# Patient Record
Sex: Female | Born: 1950
Health system: Southern US, Community
[De-identification: ages and names within clinical notes are randomized; demographics above are authoritative.]

## PROBLEM LIST (undated history)

## (undated) ENCOUNTER — Emergency Department (HOSPITAL_COMMUNITY): Admission: EM

## (undated) DIAGNOSIS — J439 Emphysema, unspecified: Secondary | ICD-10-CM

## (undated) DIAGNOSIS — L409 Psoriasis, unspecified: Secondary | ICD-10-CM

## (undated) DIAGNOSIS — I8393 Asymptomatic varicose veins of bilateral lower extremities: Secondary | ICD-10-CM

## (undated) DIAGNOSIS — G4733 Obstructive sleep apnea (adult) (pediatric): Secondary | ICD-10-CM

## (undated) DIAGNOSIS — L309 Dermatitis, unspecified: Secondary | ICD-10-CM

## (undated) DIAGNOSIS — R7303 Prediabetes: Secondary | ICD-10-CM

## (undated) DIAGNOSIS — F419 Anxiety disorder, unspecified: Secondary | ICD-10-CM

## (undated) DIAGNOSIS — M810 Age-related osteoporosis without current pathological fracture: Secondary | ICD-10-CM

## (undated) DIAGNOSIS — I779 Disorder of arteries and arterioles, unspecified: Secondary | ICD-10-CM

## (undated) DIAGNOSIS — E119 Type 2 diabetes mellitus without complications: Secondary | ICD-10-CM

## (undated) DIAGNOSIS — E785 Hyperlipidemia, unspecified: Secondary | ICD-10-CM

## (undated) DIAGNOSIS — G459 Transient cerebral ischemic attack, unspecified: Secondary | ICD-10-CM

## (undated) DIAGNOSIS — K5792 Diverticulitis of intestine, part unspecified, without perforation or abscess without bleeding: Secondary | ICD-10-CM

## (undated) DIAGNOSIS — F32A Depression, unspecified: Secondary | ICD-10-CM

## (undated) DIAGNOSIS — H269 Unspecified cataract: Secondary | ICD-10-CM

## (undated) DIAGNOSIS — K219 Gastro-esophageal reflux disease without esophagitis: Secondary | ICD-10-CM

## (undated) HISTORY — DX: Unspecified cataract: H26.9

## (undated) HISTORY — DX: Disorder of arteries and arterioles, unspecified: I77.9

## (undated) HISTORY — DX: Anxiety disorder, unspecified: F41.9

## (undated) HISTORY — DX: Emphysema, unspecified: J43.9

## (undated) HISTORY — DX: Transient cerebral ischemic attack, unspecified: G45.9

## (undated) HISTORY — DX: Obstructive sleep apnea (adult) (pediatric): G47.33

## (undated) HISTORY — DX: Hyperlipidemia, unspecified: E78.5

## (undated) HISTORY — DX: Prediabetes: R73.03

## (undated) HISTORY — DX: Psoriasis, unspecified: L40.9

## (undated) HISTORY — PX: LARYNX SURGERY: SHX692

## (undated) HISTORY — PX: ABDOMINAL HYSTERECTOMY: SHX81

## (undated) HISTORY — DX: Depression, unspecified: F32.A

## (undated) HISTORY — PX: HYSTEROTOMY: SHX1776

## (undated) HISTORY — DX: Dermatitis, unspecified: L30.9

## (undated) HISTORY — DX: Age-related osteoporosis without current pathological fracture: M81.0

## (undated) HISTORY — DX: Gastro-esophageal reflux disease without esophagitis: K21.9

## (undated) HISTORY — DX: Diverticulitis of intestine, part unspecified, without perforation or abscess without bleeding: K57.92

## (undated) HISTORY — DX: Type 2 diabetes mellitus without complications: E11.9

## (undated) HISTORY — PX: EYE SURGERY: SHX253

## (undated) HISTORY — PX: CHOLECYSTECTOMY: SHX55

## (undated) HISTORY — PX: COLON SURGERY: SHX602

## (undated) HISTORY — DX: Asymptomatic varicose veins of bilateral lower extremities: I83.93

---

## 1999-01-12 ENCOUNTER — Encounter: Admission: RE | Admit: 1999-01-12 | Discharge: 1999-01-20 | Payer: Self-pay | Admitting: Family Medicine

## 2000-02-20 ENCOUNTER — Encounter: Admission: RE | Admit: 2000-02-20 | Discharge: 2000-02-20 | Payer: Self-pay

## 2000-02-21 ENCOUNTER — Ambulatory Visit (HOSPITAL_BASED_OUTPATIENT_CLINIC_OR_DEPARTMENT_OTHER): Admission: RE | Admit: 2000-02-21 | Discharge: 2000-02-21 | Payer: Self-pay

## 2000-03-18 ENCOUNTER — Ambulatory Visit (HOSPITAL_BASED_OUTPATIENT_CLINIC_OR_DEPARTMENT_OTHER): Admission: RE | Admit: 2000-03-18 | Discharge: 2000-03-18 | Payer: Self-pay | Admitting: Family Medicine

## 2002-07-28 ENCOUNTER — Encounter: Admission: RE | Admit: 2002-07-28 | Discharge: 2002-07-28 | Payer: Self-pay | Admitting: Neurology

## 2002-07-28 ENCOUNTER — Encounter: Payer: Self-pay | Admitting: Neurology

## 2003-03-18 ENCOUNTER — Encounter: Payer: Self-pay | Admitting: Emergency Medicine

## 2003-03-18 ENCOUNTER — Emergency Department (HOSPITAL_COMMUNITY): Admission: EM | Admit: 2003-03-18 | Discharge: 2003-03-18 | Payer: Self-pay | Admitting: Emergency Medicine

## 2005-02-27 ENCOUNTER — Ambulatory Visit (HOSPITAL_COMMUNITY): Admission: RE | Admit: 2005-02-27 | Discharge: 2005-02-27 | Payer: Self-pay | Admitting: General Surgery

## 2005-08-13 ENCOUNTER — Observation Stay (HOSPITAL_COMMUNITY): Admission: EM | Admit: 2005-08-13 | Discharge: 2005-08-15 | Payer: Self-pay | Admitting: Emergency Medicine

## 2005-08-14 ENCOUNTER — Ambulatory Visit: Payer: Self-pay | Admitting: *Deleted

## 2007-01-06 ENCOUNTER — Ambulatory Visit: Payer: Self-pay | Admitting: Cardiology

## 2007-01-30 ENCOUNTER — Ambulatory Visit: Payer: Self-pay | Admitting: Cardiology

## 2007-02-11 ENCOUNTER — Ambulatory Visit: Payer: Self-pay | Admitting: Cardiology

## 2008-05-12 ENCOUNTER — Ambulatory Visit: Payer: Self-pay | Admitting: Cardiology

## 2009-01-31 ENCOUNTER — Encounter: Payer: Self-pay | Admitting: Cardiology

## 2009-02-14 DIAGNOSIS — L408 Other psoriasis: Secondary | ICD-10-CM | POA: Insufficient documentation

## 2009-02-14 DIAGNOSIS — F17200 Nicotine dependence, unspecified, uncomplicated: Secondary | ICD-10-CM | POA: Insufficient documentation

## 2009-02-14 DIAGNOSIS — J45909 Unspecified asthma, uncomplicated: Secondary | ICD-10-CM | POA: Insufficient documentation

## 2009-02-14 DIAGNOSIS — G4733 Obstructive sleep apnea (adult) (pediatric): Secondary | ICD-10-CM | POA: Insufficient documentation

## 2009-02-14 DIAGNOSIS — G459 Transient cerebral ischemic attack, unspecified: Secondary | ICD-10-CM | POA: Insufficient documentation

## 2009-02-15 ENCOUNTER — Ambulatory Visit: Payer: Self-pay | Admitting: Cardiology

## 2009-02-22 ENCOUNTER — Ambulatory Visit: Payer: Self-pay | Admitting: Cardiology

## 2009-02-24 ENCOUNTER — Telehealth: Payer: Self-pay | Admitting: Cardiology

## 2009-03-18 ENCOUNTER — Ambulatory Visit (HOSPITAL_COMMUNITY): Admission: RE | Admit: 2009-03-18 | Discharge: 2009-03-18 | Payer: Self-pay | Admitting: General Surgery

## 2009-07-19 ENCOUNTER — Inpatient Hospital Stay (HOSPITAL_COMMUNITY): Admission: EM | Admit: 2009-07-19 | Discharge: 2009-07-22 | Payer: Self-pay | Admitting: Emergency Medicine

## 2009-07-20 ENCOUNTER — Ambulatory Visit: Payer: Self-pay | Admitting: Internal Medicine

## 2009-07-20 ENCOUNTER — Encounter: Payer: Self-pay | Admitting: Cardiology

## 2009-07-23 ENCOUNTER — Inpatient Hospital Stay (HOSPITAL_COMMUNITY): Admission: EM | Admit: 2009-07-23 | Discharge: 2009-07-23 | Payer: Self-pay | Admitting: Emergency Medicine

## 2009-08-03 ENCOUNTER — Telehealth: Payer: Self-pay | Admitting: Cardiology

## 2009-08-26 ENCOUNTER — Encounter (INDEPENDENT_AMBULATORY_CARE_PROVIDER_SITE_OTHER): Payer: Self-pay | Admitting: General Surgery

## 2009-08-26 ENCOUNTER — Ambulatory Visit (HOSPITAL_COMMUNITY): Admission: RE | Admit: 2009-08-26 | Discharge: 2009-08-27 | Payer: Self-pay | Admitting: General Surgery

## 2009-09-12 ENCOUNTER — Encounter: Payer: Self-pay | Admitting: Cardiology

## 2010-07-23 DIAGNOSIS — K55069 Acute infarction of intestine, part and extent unspecified: Secondary | ICD-10-CM

## 2010-07-23 HISTORY — DX: Acute infarction of intestine, part and extent unspecified: K55.069

## 2010-08-22 NOTE — Letter (Signed)
Summary: [place] [facility] Visit  [facility] Surgery Visit   Imported By: Kassie Mends 08/16/2009 14:10:37  _____________________________________________________________________  External Attachment:    Type:   Image     Comment:   External Document

## 2010-08-22 NOTE — Letter (Signed)
Summary: Dr Jim Desanctis Rosenbower's Office Note  Dr Jim Desanctis Rosenbower's Office Note   Imported By: Roderic Ovens 10/04/2009 12:23:35  _____________________________________________________________________  External Attachment:    Type:   Image     Comment:   External Document

## 2010-08-22 NOTE — Consult Note (Signed)
Summary: Consultation Report - [facility]  Consultation Report - [facility]   Imported By: Marylou Mccoy 08/29/2009 09:41:54  _____________________________________________________________________  External Attachment:    Type:   Image     Comment:   External Document

## 2010-08-22 NOTE — Progress Notes (Signed)
Summary: NEED SURGICAL CLEARANCE  Phone Note From Other Clinic Call back at [phone]   Caller: Loney Laurence Call For: nurse Summary of Call: Message left on machine by [facility] that they need surgical clearance for patient to have surgery by Dr. Avel Peace. Nurse reviewed chart and patient last seen July 2010, had recent cath in 06/2009. Patient's gallbladder surgery is scheduled for February 1st 2011.  Initial call taken by: Carlye Grippe,  August 03, 2009 11:22 AM

## 2010-10-11 LAB — COMPREHENSIVE METABOLIC PANEL
ALT: 18 U/L (ref 0–35)
Albumin: 3.8 g/dL (ref 3.5–5.2)
Alkaline Phosphatase: 76 U/L (ref 39–117)
CO2: 28 mEq/L (ref 19–32)
Creatinine, Ser: 0.75 mg/dL (ref 0.4–1.2)
Glucose, Bld: 76 mg/dL (ref 70–99)
Total Protein: 6.9 g/dL (ref 6.0–8.3)

## 2010-10-11 LAB — DIFFERENTIAL
Basophils Absolute: 0.1 10*3/uL (ref 0.0–0.1)
Basophils Relative: 1 % (ref 0–1)
Eosinophils Relative: 1 % (ref 0–5)
Lymphocytes Relative: 21 % (ref 12–46)
Lymphs Abs: 1.6 10*3/uL (ref 0.7–4.0)
Monocytes Absolute: 0.4 10*3/uL (ref 0.1–1.0)
Monocytes Relative: 6 % (ref 3–12)

## 2010-10-11 LAB — CBC
MCHC: 34.8 g/dL (ref 30.0–36.0)
WBC: 7.4 10*3/uL (ref 4.0–10.5)

## 2010-10-23 LAB — CARDIAC PANEL(CRET KIN+CKTOT+MB+TROPI)
CK, MB: 1.4 ng/mL (ref 0.3–4.0)
Relative Index: INVALID (ref 0.0–2.5)
Relative Index: INVALID (ref 0.0–2.5)
Total CK: 43 U/L (ref 7–177)
Total CK: 54 U/L (ref 7–177)
Troponin I: 0.01 ng/mL (ref 0.00–0.06)
Troponin I: 0.06 ng/mL (ref 0.00–0.06)

## 2010-10-23 LAB — POCT CARDIAC MARKERS
CKMB, poc: <1 ng/mL — ABNORMAL LOW (ref 1.0–8.0)
CKMB, poc: <1 ng/mL — ABNORMAL LOW (ref 1.0–8.0)
Myoglobin, poc: 50.7 ng/mL (ref 12–200)
Troponin i, poc: <0.05 ng/mL (ref 0.00–0.09)
Troponin i, poc: <0.05 ng/mL (ref 0.00–0.09)
Troponin i, poc: <0.05 ng/mL (ref 0.00–0.09)

## 2010-10-23 LAB — DIFFERENTIAL
Basophils Absolute: 0 10*3/uL (ref 0.0–0.1)
Eosinophils Relative: 2 % (ref 0–5)
Eosinophils Relative: 2 % (ref 0–5)
Lymphocytes Relative: 18 % (ref 12–46)
Lymphocytes Relative: 21 % (ref 12–46)
Monocytes Absolute: 0.4 10*3/uL (ref 0.1–1.0)
Monocytes Absolute: 0.5 10*3/uL (ref 0.1–1.0)
Monocytes Relative: 5 % (ref 3–12)
Neutrophils Relative %: 72 % (ref 43–77)

## 2010-10-23 LAB — LIPASE, BLOOD: Lipase: 20 U/L (ref 11–59)

## 2010-10-23 LAB — URINALYSIS, ROUTINE W REFLEX MICROSCOPIC
Bilirubin Urine: NEGATIVE
Bilirubin Urine: NEGATIVE
Ketones, ur: NEGATIVE mg/dL
Nitrite: NEGATIVE
Protein, ur: NEGATIVE mg/dL
Protein, ur: NEGATIVE mg/dL
Specific Gravity, Urine: 1.024 (ref 1.005–1.030)
Urobilinogen, UA: 0.2 mg/dL (ref 0.0–1.0)
pH: 6.5 (ref 5.0–8.0)

## 2010-10-23 LAB — COMPREHENSIVE METABOLIC PANEL
ALT: 18 U/L (ref 0–35)
AST: 19 U/L (ref 0–37)
Albumin: 3.3 g/dL — ABNORMAL LOW (ref 3.5–5.2)
Calcium: 9 mg/dL (ref 8.4–10.5)
Creatinine, Ser: 0.58 mg/dL (ref 0.4–1.2)
GFR calc Af Amer: >60 mL/min (ref >60)
GFR calc non Af Amer: >60 mL/min (ref >60)
Potassium: 3.7 mEq/L (ref 3.5–5.1)
Sodium: 139 mEq/L (ref 135–145)
Total Protein: 6.4 g/dL (ref 6.0–8.3)

## 2010-10-23 LAB — HEPATIC FUNCTION PANEL
AST: 24 U/L (ref 0–37)
Albumin: 3.1 g/dL — ABNORMAL LOW (ref 3.5–5.2)
Albumin: 3.2 g/dL — ABNORMAL LOW (ref 3.5–5.2)
Alkaline Phosphatase: 69 U/L (ref 39–117)
Indirect Bilirubin: 0.2 mg/dL — ABNORMAL LOW (ref 0.3–0.9)
Total Bilirubin: 0.4 mg/dL (ref 0.3–1.2)
Total Bilirubin: 0.7 mg/dL (ref 0.3–1.2)
Total Protein: 6.2 g/dL (ref 6.0–8.3)

## 2010-10-23 LAB — CBC
HCT: 38.4 % (ref 36.0–46.0)
HCT: 40.3 % (ref 36.0–46.0)
HCT: 42.6 % (ref 36.0–46.0)
Hemoglobin: 14.1 g/dL (ref 12.0–15.0)
Hemoglobin: 15 g/dL (ref 12.0–15.0)
MCHC: 34.8 g/dL (ref 30.0–36.0)
MCHC: 35 g/dL (ref 30.0–36.0)
MCHC: 35.1 g/dL (ref 30.0–36.0)
MCHC: 35.3 g/dL (ref 30.0–36.0)
MCHC: 35.7 g/dL (ref 30.0–36.0)
MCV: 95.6 fL (ref 78.0–100.0)
Platelets: 206 10*3/uL (ref 150–400)
Platelets: 242 10*3/uL (ref 150–400)
Platelets: 243 10*3/uL (ref 150–400)
RBC: 4.27 MIL/uL (ref 3.87–5.11)
RBC: 4.5 MIL/uL (ref 3.87–5.11)
RDW: 13.6 % (ref 11.5–15.5)
RDW: 13.8 % (ref 11.5–15.5)
RDW: 13.9 % (ref 11.5–15.5)
RDW: 14 % (ref 11.5–15.5)
WBC: 6.4 10*3/uL (ref 4.0–10.5)
WBC: 9 10*3/uL (ref 4.0–10.5)

## 2010-10-23 LAB — BASIC METABOLIC PANEL
BUN: 10 mg/dL (ref 6–23)
CO2: 27 mEq/L (ref 19–32)
CO2: 27 mEq/L (ref 19–32)
CO2: 28 mEq/L (ref 19–32)
Calcium: 8.8 mg/dL (ref 8.4–10.5)
Calcium: 9.4 mg/dL (ref 8.4–10.5)
Chloride: 106 mEq/L (ref 96–112)
Creatinine, Ser: 0.7 mg/dL (ref 0.4–1.2)
GFR calc Af Amer: >60 mL/min (ref >60)
GFR calc Af Amer: >60 mL/min (ref >60)
GFR calc Af Amer: >60 mL/min (ref >60)
GFR calc non Af Amer: >60 mL/min (ref >60)
GFR calc non Af Amer: >60 mL/min (ref >60)
GFR calc non Af Amer: >60 mL/min (ref >60)
Glucose, Bld: 113 mg/dL — ABNORMAL HIGH (ref 70–99)
Glucose, Bld: 129 mg/dL — ABNORMAL HIGH (ref 70–99)
Glucose, Bld: 92 mg/dL (ref 70–99)
Potassium: 4 mEq/L (ref 3.5–5.1)
Potassium: 4.2 mEq/L (ref 3.5–5.1)
Potassium: 4.3 mEq/L (ref 3.5–5.1)
Sodium: 138 mEq/L (ref 135–145)
Sodium: 141 mEq/L (ref 135–145)

## 2010-10-23 LAB — LIPID PANEL
Cholesterol: 309 mg/dL — ABNORMAL HIGH (ref 0–200)
LDL Cholesterol: UNDETERMINED mg/dL (ref 0–99)
Triglycerides: 459 mg/dL — ABNORMAL HIGH (ref <150)

## 2010-10-23 LAB — ABO/RH: ABO/RH(D): A POS

## 2010-10-23 LAB — CK TOTAL AND CKMB (NOT AT ARMC)
CK, MB: 1.3 ng/mL (ref 0.3–4.0)
CK, MB: 1.4 ng/mL (ref 0.3–4.0)
Relative Index: INVALID (ref 0.0–2.5)
Total CK: 52 U/L (ref 7–177)

## 2010-10-23 LAB — APTT: aPTT: 30 seconds (ref 24–37)

## 2010-10-23 LAB — TYPE AND SCREEN: ABO/RH(D): A POS

## 2010-10-23 LAB — TROPONIN I: Troponin I: 0.01 ng/mL (ref 0.00–0.06)

## 2010-10-23 LAB — PROTIME-INR: Prothrombin Time: 11.7 seconds (ref 11.6–15.2)

## 2010-10-28 LAB — COMPREHENSIVE METABOLIC PANEL
ALT: 18 U/L (ref 0–35)
AST: 18 U/L (ref 0–37)
Albumin: 3.7 g/dL (ref 3.5–5.2)
CO2: 29 mEq/L (ref 19–32)
Calcium: 9.6 mg/dL (ref 8.4–10.5)
Creatinine, Ser: 0.68 mg/dL (ref 0.4–1.2)
GFR calc Af Amer: >60 mL/min (ref >60)
Sodium: 142 mEq/L (ref 135–145)

## 2010-10-28 LAB — DIFFERENTIAL
Eosinophils Absolute: 0 10*3/uL (ref 0.0–0.7)
Eosinophils Relative: 1 % (ref 0–5)
Lymphocytes Relative: 21 % (ref 12–46)
Lymphs Abs: 1.8 10*3/uL (ref 0.7–4.0)
Monocytes Absolute: 0.5 10*3/uL (ref 0.1–1.0)
Monocytes Relative: 6 % (ref 3–12)

## 2010-10-28 LAB — CBC
HCT: 46.3 % — ABNORMAL HIGH (ref 36.0–46.0)
MCHC: 34.7 g/dL (ref 30.0–36.0)
MCV: 93.2 fL (ref 78.0–100.0)
Platelets: 244 10*3/uL (ref 150–400)
RDW: 13.3 % (ref 11.5–15.5)

## 2010-12-05 NOTE — Assessment & Plan Note (Signed)
[facility]                          [facility] NOTE   NAME:Brianna Nichols, Brianna Nichols                         MRN:          [id number]  DATE:02/11/2007                            DOB:          12-09-1950    PRIMARY:  Dr. Doreen Beam.   REASON FOR PRESENTATION:  Evaluate the patient for chest discomfort and  abnormal stress perfusion study.   HISTORY OF PRESENT ILLNESS:  The patient is a pleasant 60 year old who  was hospitalized in June with discomfort that ultimately was found to be  diverticulitis.  She says she was treated with Nubain and she thinks she  had a reaction.  She eventually got chest discomfort and some low blood  pressures.  She was seen in consultation.  She had a CT that was  negative for any evidence of pulmonary embolism.  She had negative  cardiac enzymes.  She did have an echocardiogram, which demonstrated  well-preserved ejection fraction, mild TR, mild pulmonary hypertension.  Her EF was 65% with normal wall motion.  She subsequently had an  outpatient stress perfusion study, which demonstrated an EF of 70%.  She  achieved 8.5 METS.  There was a small partially reversible anterior  defect, questionably ischemia versus breast attenuation.   The patient returns today and says she was able to mow her entire lawn  this weekend without chest discomfort.  She was fatigued.  She has  continued to have abdominal discomfort, consistent with her previous  diverticulitis.  However, she has not had any exercise-induced chest  pressure, neck or arm discomfort.  She has had no palpitations, pre-  syncope, or syncope.  She has had no new shortness of breath.  Denies  any PND or orthopnea.  She has had some fleeting sporadic chest  pressure.   PAST MEDICAL HISTORY:  Diverticulitis, asthma, psoriasis, obstructive  sleep apnea (she has been intolerant of CPAP and has an upcoming sleep  study).  Long-standing tobacco abuse.  Hysterectomy.   Transient ischemic  attack.   ALLERGIES:  MORPHINE.  NUBAIN.  PENICILLIN.  BENADRYL.   MEDICATIONS:  Aspirin.   REVIEW OF SYSTEMS:  As stated in the HPI and otherwise negative for  other systems.   PHYSICAL EXAMINATION:  The patient is in no acute distress.  Blood pressure 117/88, heart rate 87 and irregular.  Weight 150.8  pounds.  HEENT:  Eyelids unremarkable.  Pupils are equal, round, and reactive to  light and accommodation.  Fundi not visualized.  Oral mucosa  unremarkable.  NECK:  No jugular venous distension, wave form within normal limits,  carotid upstroke brisk and symmetric, no bruits, thyromegaly.  LYMPHATICS:  No cervical, axillary, or inguinal adenopathy.  LUNGS:  Clear to auscultation bilaterally.  BACK:  No costovertebral angle tenderness.  CHEST:  Unremarkable.  HEART:  PMI not displaced or sustained, S1 and S2 within normal limits,  no S3, no S4, no clicks, rubs, murmurs, distant heart sounds.  ABDOMEN:  Flat, positive bowel sounds, normal in frequency and pitch, no  bruits, rebound, guarding.  No midline pulsatile mass, hepatomegaly,  splenomegaly.  SKIN:  No rashes, no nodules.  EXTREMITIES:  With 2+ pulses throughout, no edema, cyanosis, clubbing.  NEURO:  Oriented to person, place, and time, cranial nerves 2-12 grossly  intact, motor grossly intact.   EKG:  Sinus rhythm.  Rate 85.  Axis within normal limits.  Intervals  within normal limits.  No acute ST-T wave change.   ASSESSMENT AND PLAN:  1. Chest discomfort.  The patient had chest discomfort in the      hospital, but is not getting this kind of discomfort any longer.      She is able to be very active without bringing on any symptoms.      She had a stress perfusion study, which was either normal with      breast attenuation or demonstrated a very low risk reversible      anterior ischemia.  We talked at great length about this.  At this      point, I do not think with her very high functional  level and      absence of any unstable symptoms that further evaluation with      catheterization is warranted.  However, she needs aggressive risk      reduction.  2. We had a long discussion about the need to stop smoking.  She has      cut back from a carton a week to a pack a week.  She understands      the need to quit completely and will try to do this on her own.  3. Risk reduction.  Her total cholesterol is 240, HDL 28.  Her      triglycerides were 417.  LDL was not calculated and there is no      direct LDL.  The patient needs aggressive dietary modification and      I would suggest statin therapy, but will defer to Dr. Sherril Croon.      Currently, she is having enough GI complaints that I would not      start one of these drugs.     Rollene Rotunda, MD, [facility]  Electronically Signed    JH/MedQ  DD: 02/11/2007  DT: 02/11/2007  Job #: [id number]

## 2010-12-05 NOTE — Assessment & Plan Note (Signed)
[facility]                          [facility] NOTE   NAME:Brianna Nichols, Brianna Nichols                         MRN:          [id number]  DATE:02/15/2009                            DOB:          23-Nov-1950    REFERRING PHYSICIAN:  Adolph Pollack, M.D.   PRIMARY CARE PHYSICIAN:  Doreen Beam, MD   REASON FOR CONSULTATION:  Evaluate the patient preoperatively.  She has  multiple cardiovascular risk factors, chest pain, and is being referred  for cholecystectomy.   HISTORY OF PRESENT ILLNESS:  The patient has been seen in this clinic in  the past.  She has no prior cardiac history but has had evaluation for  chest pain.  She had a stress perfusion study in 2008 and demonstrated  an ejection fraction of 70% with a questionable small partial reversible  anterior defect.  However, it was felt to be very low-risk scan, and she  was managed medically.  She has had echocardiograms as well.  The last  was in 2009 demonstrating well-preserved ejection fraction, very mildly  elevated right ventricular systolic pressure of 32, but no valvular  abnormalities.   She is now being considered for cholecystectomy.  She describes chest  discomfort when she walks the dog.  This has been happening from the  last couple of weeks.  It is tightness in her midchest.  It goes away  when she stops what she is doing.  She does not have this at rest.  She  is not describing shortness of breath.  She does not have excessive  diaphoresis with this.  She does not describe any resting symptoms such  as PND or orthopnea.  She has not had any palpitations, presyncope, or  syncope.  She has multiple aches and pains.  She describes occasional  stinging in her chest, some pain in her posterior neck with movement.  She recently had some facial pain and burning that she attributed to  taking Levaquin for bronchitis.  She is not sure whether any of her  symptoms are at all similar to  complaints she had back in 2008 as she  does not recall these events.   PAST MEDICAL HISTORY:  1. Diverticulitis.  2. Asthma.  3. Psoriasis.  4. Obstructive sleep apnea (intolerant to CPAP).  5. Borderline dyslipidemia by her report.  6. Tobacco abuse.  7. Mild hypertension.  8. Apparent transient ischemic attack.   PAST SURGICAL HISTORY:  1. Tubal ligation.  2. Hysterectomy.  3. Vein stripping.   ALLERGIES:  MORPHINE, NUBAIN, PENICILLIN, and BENADRYL.   MEDICATIONS:  None.   SOCIAL HISTORY:  The patient has children, grandchildren, and great-  grandchildren.  She has been smoking at least 1 pack per day greater  than 20 years.   Family history is remarkable for her brother dying of myocardial  infarction at age 60.  Her father had later-onset heart disease.   REVIEW OF SYSTEMS:  As stated in the HPI and otherwise negative for all  other systems.   PHYSICAL EXAMINATION:  GENERAL:  The patient is pleasant and  in no  distress.  VITAL SIGNS:  Blood pressure 130/81, heart 81 and regular, weight 152  pounds.  HEENT:  Eyes unremarkable.  Pupils equal, round, and reactive to light.  Fundi not visualized.  Oral mucosa unremarkable.  NECK:  No jugular venous distention at 45 degrees.  Carotid upstroke  brisk and symmetric.  No bruits.  No thyromegaly.  LYMPHATICS:  No cervical, axillary, or inguinal adenopathy.  LUNGS:  Clear to auscultation bilaterally.  BACK:  No costovertebral angle tenderness.  CHEST:  Unremarkable.  HEART:  PMI not displaced or sustained.  S1 and S2 within normal limits.  No S3.  No S4.  No clicks.  No rubs.  No murmurs.  ABDOMEN:  Flat.  Positive bowel sounds, normal in frequency and pitch.  No bruits.  No rebound.  No guarding.  No midline pulsatile mass.  No  hepatomegaly.  No splenomegaly.  SKIN:  No rashes.  No nodules.  EXTREMITIES:  Pulses 2+ throughout.  No edema.  No cyanosis.  No  clubbing.  NEUROLOGIC:  Oriented to person, place, and time.   Cranial nerves II  through XII grossly intact.  Motor grossly intact.   EKG:  Sinus rhythm, rate 78, axis within normal limits, intervals within  normal limits, no acute ST-wave changes.   ASSESSMENT AND PLAN:  1. Chest pain.  The patient does have chest pain.  She has multiple      cardiovascular risk factors.  The pretest probability of      obstructive coronary disease is moderate.  I do not think cardiac      catheterization is indicated.  However, stress perfusion testing is      indicated.  She would be able to walk on a treadmill but could be      converted to an adenosine if needed.  She will have an adenosine      Myoview.  If this demonstrates no change from the previous or is      normal, then she would be at acceptable risk for the planned      surgery.  2. Tobacco.  I discussed the need to stop smoking (greater than 3      minutes).  She does not want to try Chantix.  She will consider      Wellbutrin.  She will consider the patches.  She understands the      importance of this.  3. Risk reduction.  I think this patient should take an aspirin given      her family history.  This could be started after the surgery.  I do      not know her lipid status but would have a low threshold for statin      therapy, although it seems like she is resistant to medications.  I      will defer to Dr. Sherril Croon.  4. Hypertension.  She reports some mild hypertension, but I see her      blood pressure to be normal.  No further therapy is indicated at      this point.  5. Follow up will be as-needed based on the results of the stress      perfusion study.     Rollene Rotunda, MD, [facility]  Electronically Signed    JH/MedQ  DD: 02/15/2009  DT: 02/15/2009  Job #: [id number]   cc:   Adolph Pollack, M.D.  Doreen Beam, MD

## 2010-12-08 NOTE — H&P (Signed)
Brianna Nichols, Brianna Nichols                  ACCOUNT NO.:  [id number]   MEDICAL RECORD NO.:  [id number]          PATIENT TYPE:  OBV   LOCATION:  A223                          FACILITY:  [facility]   PHYSICIAN:  Hanley Hays. Dechurch, M.D.DATE OF BIRTH:  12-Oct-1950   DATE OF ADMISSION:  08/13/2005  DATE OF DISCHARGE:  LH                                HISTORY & PHYSICAL   HISTORY OF PRESENT ILLNESS:  A 60 year old Caucasian female with a history  of tobacco abuse, obstructive sleep apnea, and she states high  cholesterol, though she is not on any medications, who presented to the  emergency room today with chest pain, weakness that started actually 5 days  prior, acutely while at work at her desk.  She states she had the sudden  onset of stabbing chest pain which she described as severe.  She has felt  weak and dizzy.  The pain radiates into her left neck and into the under  part of her left upper arm.  It persisted for several hours, but began to  ease off.  She states she has had the pain intermittently over the course of  the last 5 days.  Simultaneously, she developed low back pain with radiation  to the right scapula.  She also described her pain as going through-and-  through.  She had nausea but no vomiting.  She felt short of breath, but had  no increased dyspnea on exertion.  She was able to continue working in her  normal activity.  Because of this persisting discomfort, she presented to  the emergency room today where her point of care markers are negative.  Her  EKG is essentially negative or normal, and her labs, aside from a slightly  decreased albumin of 3.4, are unremarkable.   FAMILY HISTORY:  She has a significant family history, and a brother died in  his 68s secondary to coronary artery disease.  Father died at age 40 due to  a heart attack.  She has 5 sisters, but none with significant heart disease.  Sister with irritable bowel syndrome.  She is the middle child of 7 or 8  siblings that are still living.   SOCIAL HISTORY:  The patient smokes a pack per day, and has done so since  teenage years, x35 years.  Denies any alcohol or drug abuse.  She is  married.  Husband does not smoke.  She has 2 daughters, ages 47 and 64; one  daughter has some problem with her ventricle.  She works as a Automotive engineer.   PAST MEDICAL HISTORY:  1.  Diagnosed with obstructive sleep apnea via sleep study as an outpatient.  2.  History of right abdominal wall mass status post excision in August with      unremarkable pathology apparently.  At that time, she had an MR of her      abdomen, which was also completely normal.  3.  Gravida 2, para 2, A0.  4.  Status post vein ligation/procedure in 2001 for varicose veins.  5.  History of chest pain  work-up in the last 8 or 9 years at [facility].  She had a stress test and she stated she nearly passed out,      but apparently was discharged and no further work-up was undertaken.      Records are pending.   She has not had a regular physician that she sees on a regular basis.   MEDICATIONS:  She is on no prescribed medicines, and takes no over-the-  counter medicines, though she has started taking a cayenne herbal  supplement, which she has only taken several times in the last week or so.   ALLERGIES:  She has multiple allergies including -  1.  MORPHINE which made her feel strange.  2.  PENICILLIN which gave her a rash.  3.  BENADRYL gave her chest pain.  4.  ANESTHESIA, which she apparently does not tolerate.  5.  She did have nausea after a dose of DILAUDID in the emergency room.   REVIEW OF SYSTEMS:  Aside from the above, denies any GI complaints, though  she has noted more frequent bowel movements.  No weight gain or loss.  She  suntans in a tanning bed.  She is complaining of pruritus currently.  She  has a chronic rash on her right ankle, and it affects her elbows, as well.  She sleeps fairly  well, but notes some restless legs.  She has been  diagnosed with obstructive sleep apnea, but would not wear the BiPAP mask,  as it caused a rash on her face.  She stated she was told she had multiple  mini-strokes by Dr. Sandria Manly on a brain scan, though we do not have those  results available to [place].   PHYSICAL EXAMINATION:  GENERAL:  A well-developed, well-nourished female who  is scratching everywhere, but no lesions are noted, no hives.  NECK:  Supple.  There is no JVD or adenopathy.  No bruits present.  HEENT:  Oropharynx is clear.  Teeth are intact.  Pupils are reactive.  Extraocular muscles intact.  Tongue is midline.  NEUROLOGIC:  Nonfocal.  She moves easily in the bed.  LUNGS:  A few fine rhonchi at the right base which clear with cough.  Breath  sounds are decreased but clear.  HEART:  Regular.  No murmur, gallop, or rub are noted.  ABDOMEN:  Protuberant, soft and nontender without mass.  She had a surgical  scar in the right upper quadrant secondary to a lipoma excision.  EXTREMITIES:  Without clubbing, cyanosis, or edema.  She has equal pulses  bilaterally in dorsal pedis, and femorals without bruits.   ASSESSMENT AND PLAN:  1.  Atypical chest pain.  The patient is actually more focused on her low      back pain.  She was told at one point she had a bulging disk, though      there are no records to support that.  Certainly, we need to rule out a      cardiac history.  Even though her findings this evening are not      consistent, and did not really improve much with nitroglycerin, she has      significant risk factors which would need  amelioration, and she was      counseled on smoking cessation, as well as the need for further      evaluation.  Lipids will be obtained in the morning.  We will check a  TSH.  We will avoid the Dilaudid secondary to the nausea, and monitor.      Will minimize medication usage in this lady, given her multiple     allergies and somewhat  somatic nature.  An echocardiogram will be      ordered, as well.  We will have cardiology evaluate the patient      hopefully, as she was encouraged to proceed with outpatient follow up      for these other issues once she is discharged.  2.  The patient also has an excoriated raised rash on the right ankle which      possibly could be psoriasis versus nummular eczema.  There is a smaller      lesion on her right and lateral shin, but no other lesions are noted.      Topical corticosteroid will be ordered.   The plan was discussed with the patient and her husband at length, who seem  to have reasonable understanding, including the fact that we may not know  exactly what all her pains are by the time she is discharged, but we will  rule out the life-threatening issues and proceed as an outpatient with  outpatient evaluation if pain remains a problem.      Hanley Hays Josefine Class, M.D.  Electronically Signed    FED/MEDQ  D:  08/13/2005  T:  08/13/2005  Job:  [id number]

## 2010-12-08 NOTE — Discharge Summary (Signed)
NAMEDONNIE, Brianna Nichols                  ACCOUNT NO.:  [id number]   MEDICAL RECORD NO.:  [id number]          PATIENT TYPE:  OBV   LOCATION:  A223                          FACILITY:  [facility]   PHYSICIAN:  Osvaldo Shipper, MD     DATE OF BIRTH:  Apr 01, 1951   DATE OF ADMISSION:  08/13/2005  DATE OF DISCHARGE:  01/24/2007LH                                 DISCHARGE SUMMARY   PRIMARY MEDICAL DOCTOR:  Dr. Lia Hopping in [place], [place].   DISCHARGE DIAGNOSES:  1.  Chest pain, likely musculoskeletal, likely fibromyalgia, need to      followup with PMD.  2.  Dyslipidemia.   Please review H&P dictated at the time of admission for details regarding  the patient's presenting illness.   BRIEF HOSPITAL COURSE:  Briefly, this is a 60 year old Caucasian female who  has a past medical history of tobacco use, obstructive sleep apnea,  dyslipidemia who presented to the ED with chest pain.  The patient was very  atypical for cardiac etiology. It was described as sharp stabbing pain  radiating to the upper back as well as to the lower back.  Onset of which  was about 5 days prior to this admission. The patient's pain persisted for  the next few days which bothered her and she decided to come into the ED.  She had some nausea with this pain, but no emesis.  She did not have any  dyspnea on exertion.  Hence, because of her history of dyslipidemia she was  admitted to rule out acute coronary syndrome.   [facility] was consulted to see the patient who recommended doing an  inpatient stress test on her.  The patient ruled out for acute coronary  syndrome by serial cardiac enzymes.  She had an echocardiogram as well which  showed an EF of 55-60% with no wall motion abnormalities.  The right  ventricle was normal in size with normal systolic function. No other  abnormalities were noted.  Her stress test was reported to me by Dr. Dorethea Clan  as being completely normal.   Because of the character of her  pain we also obtained a CAT scan of her  chest which ruled out PE as well as any aortic dissection.  The patient also  underwent plain film of her lumbar spine which also did not show any  problems.  The patient's pain was somewhat reproducible to palpation.  The  likely etiology for her chest pain includes possible GERD, possibly  musculoskeletal, maybe fibromyalgia.  The patient was explained all of the  above.  She was told that she needs to followup with her PMD to further  evaluate her pain symptoms.  On the day of discharge her vital signs were  all stable. Her blood work has all been stable. She is considered stable for  discharge in view of the above findings.   As part of her initial blood work we also obtained a lipid profile which  came back showing total cholesterol 263, triglycerides 525, and HDL 30.   DISCHARGE MEDICATIONS:  1.  She may continue using her aspirin as before.  2.  I am starting her on Lipitor 20 mg at night time.  3.  Protonix 40 mg once daily.   FOLLOWUP:  With her primary doctor in 1 or 2 weeks' time.   CONSULTATIONS:  Obtained from [facility] which is appreciated.   IMAGING STUDIES:  Chest x-ray no acute cardiopulmonary process was noted.  CT angiogram discussed above.  X-ray of lumbar spine discussed above.   DIET:  The patient will continue a heart healthy diet.   PHYSICAL ACTIVITY:  No restrictions.      Osvaldo Shipper, MD  Electronically Signed     GK/MEDQ  D:  08/15/2005  T:  08/15/2005  Job:  [id number]   cc:   Vida Roller, M.D.  Fax: [phone]   Lia Hopping  Fax: [phone]

## 2010-12-08 NOTE — Op Note (Signed)
[place]  Patient:    Brianna Nichols                          MRN: [id number] Proc. Date: 02/21/00 Adm. Date:  [id number] Attending:  Meredith Leeds                           Operative Report  PREOPERATIVE DIAGNOSIS:  Bilateral varicose veins.  OSTOPERATIVE DIAGNOSIS:  Bilateral varicose veins.  OPERATION PERFORMED: 1. Ligation of the long saphenous veins bilaterally. 2. Excision of varicose veins using the Trivex powered phlebectomy system.  SURGEON:  Zigmund Daniel, M.D.  ANESTHESIA:  General.  DESCRIPTION OF PROCEDURE:  Following induction of anesthesia and routine preparation and draping of the lower extremities with the patient well monitored, I made small incisions over the proximal part of the long saphenous vein bilaterally and ligated it in the thigh.  I did not dissect it out at the saphenofemoral junction.  I made a secondary ligation distally on each side. Using small incisions, I infused dilute local anesthetic solution into the subcutaneous tissues in the area of the marked varicose vein clusters.  Using the Trivex transilluminated powered phlebectomy system, I transilluminated the veins and resected them with the suction cutting device getting effective removal of the veins as seen by development of bleeding and removal of the veins.  I then in all locations did secondary tumescence injection and rolled out the fluid and blood.  In one area in the medial left thigh, I used a third stage of tumescence and used the suction to remove a hemotoma which I could see.  There was no continued bleeding.  I then closed the larger incisions with intercuticular 4-0 Vicryl and Steri-Strips and I closed the small incisions with a Steri-Strip alone.  I applied bulky compressive bandages. The patient tolerated the procedure well. DD:  02/21/00 TD:  02/22/00 Job: [id number]

## 2010-12-08 NOTE — Procedures (Signed)
[name], [name]                  ACCOUNT NO.:  [id number]   MEDICAL RECORD NO.:  [id number]          PATIENT TYPE:  OBV   LOCATION:  A223                          FACILITY:  [facility]   PHYSICIAN:  Vida Roller, M.D.   DATE OF BIRTH:  1951-07-21   DATE OF PROCEDURE:  DATE OF DISCHARGE:                                    STRESS TEST   HISTORY:  [name] is a 60 year old female with no known coronary disease  with atypical chest comfort. Her cardiac enzymes were negative x3 for acute  myocardial infarction. She has no acute EKG changes. Chest CT is negative  for pulmonary embolus and negative for aortic dissection.   BASELINE DATA:  Electrocardiogram reveals a sinus rhythm at 69 beats per  minute. Blood pressure is 100/68.   Initially, [name] walked on the treadmill for 4 minutes and 57 seconds to  Bruce protocol stage II. She had sudden onset of dizziness and therefore  exercise was stopped. Maximum heart rate achieved was 115 beats per minute  which is 69% predicted of maximum. She reported worsening of her back  discomfort with exercise. No EKG changes or arrhythmias were noted during  exercise.   Adenosine Myoview was started. Baseline data electrocardiogram reveals sinus  rhythm at 71 beats per minute. Blood pressure is 128/74.   Thirty seven milligrams of adenosine was infused over 4-minute protocol.  Myoview was injected at 3 minutes. The patient reported chest burning,  throat pain and a flushed feeling that resolved during recovery.  Electrocardiogram reveals no arrhythmias. No EKG changes significant for  ischemia.   Final images and results are pending MD review.      Jae Dire, P.A. [facility]      Vida Roller, M.D.  Electronically Signed    AB/MEDQ  D:  08/15/2005  T:  08/15/2005  Job:  [id number]

## 2010-12-08 NOTE — Consult Note (Signed)
Brianna Nichols, Brianna Nichols                  ACCOUNT NO.:  [id number]   MEDICAL RECORD NO.:  [id number]          PATIENT TYPE:  OBV   LOCATION:  A223                          FACILITY:  [facility]   PHYSICIAN:  Vida Roller, M.D.   DATE OF BIRTH:  25-Jun-1951   DATE OF CONSULTATION:  08/14/2005  DATE OF DISCHARGE:                                   CONSULTATION   PRIMARY CARE PHYSICIAN:  Dr. Olena Leatherwood.   HISTORY OF PRESENT ILLNESS:  Ms. Edick is a 60 year old female with past  medical history significant for sleep apnea and dyslipidemia per her report.  She presented to [facility] with complaints of chest discomfort and  back pain.  She reports onset of stabbing central chest discomfort radiating  to bilateral neck and left arm approximately five days ago while at rest.  She states she has had intermittent episodes since that time.  These  episodes last approximately one hour and are associated with diaphoresis,  shortness of breath and nausea.  She states that approximately three days  ago, she noted pain was migrating from her chest into her back down into her  lower back.  She states since three days ago she has had constant pain  somewhere in her body, whether it is in her chest, upper back or lower back.  She denies any aggravating or alleviating factors.  She denies any  exertional component to the pain.  She states that she has been treated with  Dilaudid and OxyContin since admission to the hospital and noted some  improvement in the pain.  However, she does not like the way that Dilaudid  or OxyContin make her feel.   PAST MEDICAL HISTORY:  1.  Sleep apnea diagnosed by a sleep study.  The patient does not wear a      CPAP secondary to the mask causing a rash on her face.  2.  Dyslipidemia per her report.  3.  History of right abdominal wall mass with excision in August.  4.  She is status post vein ligation in 2001 for varicose veins.  5.  Treadmill stress test approximately five  years ago per her report at      [facility].  This was negative by the patient's report.  I do      not have the records from this currently.  She states, however, more      recently, she has had another stress test ordered by Dr. Olena Leatherwood.      Again, I do not have the results of this stress test either.   ALLERGIES:  MORPHINE, PENICILLIN, BENADRYL, ANESTHESIA AND DILAUDID.   MEDICATIONS PRIOR TO ADMISSION:  Aspirin 81 mg as needed.   HOSPITAL MEDICATIONS:  1.  Aspirin 81 mg daily.  2.  __________ointment, topical, b.i.d.   SOCIAL HISTORY:  Ms. Summerhill lives in [place] with her husband.  She is a Automotive engineer.  She is married with two children who are grown.  She has a 35-pack year smoking history.  Denies any alcohol or illicit drug  use.  She does not exercise of follow any special diet.   FAMILY HISTORY:  Mother is deceased secondary to renal cell carcinoma.  Father is deceased at 78 years old secondary to MI.  She has one brother  deceased in his 95's secondary to MI, and five sisters who are living with  known coronary disease.   REVIEW OF SYSTEMS:  CONSTITUTIONAL:  Subjective fevers and chills for the  last 2-3 days.  However, no documented fever.  HEENT:  No congestion or sore  throat.  SKIN:  No rashes or lesions reported.  CARDIOPULMONARY:  As per  HPI, plus orthopnea and PND felt to be secondary to sleep apnea.  Occasional  lightheadedness not related to episodes of pain.  GU:  No frequency or  dysuria.  NEUROPSYCHIATRIC:  Numbness in her toes.  MUSCULOSKELETAL:  Cramps  in her feet and legs.  GI:  Positive for nausea, one episode of emesis  yesterday.  No diarrhea or constipation.  No bright red blood per rectum.  No melena.  Some mild abdominal discomfort and increased frequency of bowel  movements.  However, no loose stools.  All other systems reviewed were  negative except per HPI.   PHYSICAL EXAMINATION:  VITAL SIGNS:  Temperature 98.2, pulse 92,   respirations 19, blood pressure 97/62.  GENERAL APPEARANCE:  Well-developed, well-nourished female in no acute  distress.  HEENT:  Normocephalic, atraumatic.  Pupils equal, round and reactive to  light.  NECK:  No lymphadenopathy.  No carotid bruits or jugular venous distention  noted.  CARDIOVASCULAR:  Regular rate and rhythm.  Normal S1, S2.  No murmurs are  appreciated.  LUNGS:  Clear to auscultation bilaterally.  SKIN:  Warm and dry.  BREASTS:  Deferred.  ABDOMEN:  Soft, nontender with active bowel sounds.  GU/RECTAL:  Deferred.  EXTREMITIES:  No cyanosis, clubbing or edema noted.  Distal pulses are  intact bilaterally.  No joint deformity is noted.  NEUROLOGICAL:  Alert and oriented x3.  Cranial nerves 2-12 grossly intact.   STUDIES:  Chest x-ray:  No acute cardiopulmonary disease.   Electrocardiogram:  Sinus rhythm at 72 beats per minute.  Normal axis.  Normal intervals.  No acute ST-T wave changes.   LABORATORY DATA:  White blood cells 5.3, hemoglobin 14, hematocrit 41,  platelets 268,000.  Sodium 136, potassium 3.7, chloride 107, bicarbonate 25,  BUN 9, creatinine 0.6, glucose 83, total bilirubin 0.4, alk-phos 83, AST 20,  ALT 18, total protein 6.6, albumin 3.4, D. dimer 0.22, calcium 8.8, cardiac  enzymes negative x1.  ABG:  pH 7.38, PCO2 41, PO2 67, bicarbonate 24, oxygen  saturation 95% on room air.  Urinalysis is within normal limits.   IMPRESSION/PLAN:  Atypical chest discomfort.  Patient with no known coronary  artery disease.  Cardiac risk factors include family history, tobacco abuse  and elevated lipids.  The patient will continue to have serial cardiac  enzymes to rule out for acute myocardial infarction.  EKG revealed no acute  changes.  D. dimer is within normal limits.  The patient will have an  echocardiogram today for further evaluation.  If echocardiogram evaluation  reveals normal LV systolic function, will proceed with the stress Myoview in the   morning.  If LV function is depressed, will consider further evaluation with  cardiac catheterization.   We appreciate this consult and will be happy to follow this patient along  with you.      Amy Mercy Riding, P.A. [facility]  Vida Roller, M.D.  Electronically Signed    AB/MEDQ  D:  08/14/2005  T:  08/14/2005  Job:  [id number]

## 2010-12-08 NOTE — Procedures (Signed)
Brianna Nichols, Brianna Nichols                  ACCOUNT NO.:  [id number]   MEDICAL RECORD NO.:  [id number]          PATIENT TYPE:  OBV   LOCATION:  A223                          FACILITY:  [facility]   PHYSICIAN:  Vida Roller, M.D.   DATE OF BIRTH:  [birth year]   DATE OF PROCEDURE:  08/14/2005  DATE OF DISCHARGE:                                  ECHOCARDIOGRAM   PRIMARY CARE PHYSICIAN:  Osvaldo Shipper, M.D.   TAPE NUMBER:  [id number].  Tape count [id number].   INDICATIONS:  This is a 60 year old woman with chest pain.  Technical  quality study is adequate.   M-MODE TRACINGS:  The aorta is 25 mm.   The left atrium is 3 8 mm.   Septum is 10 mm.   Posterior wall is 10 mm.   Left ventricular diastolic dimension is 35 mm.   Left ventricular systolic dimension is 25 mm.   Two-dimensional echocardiogram and Doppler imaging:  The left ventricle is  normal size with preserved LV systolic function and estimated ejection  fraction 55-60%.  There are no wall motion abnormalities.   The right ventricle is top normal in size with normal systolic function.   Both atria are normal size.   The aortic valve is without significant stenosis or regurgitation.   The mitral valve is without significant stenosis or regurgitation.   Tricuspid valve has mild regurgitation.   There is no pericardial effusion.      Vida Roller, M.D.  Electronically Signed     JH/MEDQ  D:  08/14/2005  T:  08/14/2005  Job:  [id number]

## 2011-04-17 DIAGNOSIS — I2510 Atherosclerotic heart disease of native coronary artery without angina pectoris: Secondary | ICD-10-CM | POA: Insufficient documentation

## 2011-04-17 DIAGNOSIS — E782 Mixed hyperlipidemia: Secondary | ICD-10-CM | POA: Insufficient documentation

## 2011-04-17 DIAGNOSIS — I493 Ventricular premature depolarization: Secondary | ICD-10-CM | POA: Insufficient documentation

## 2011-06-29 DIAGNOSIS — D6859 Other primary thrombophilia: Secondary | ICD-10-CM | POA: Insufficient documentation

## 2011-10-12 DIAGNOSIS — Z9049 Acquired absence of other specified parts of digestive tract: Secondary | ICD-10-CM | POA: Insufficient documentation

## 2012-04-02 DIAGNOSIS — J432 Centrilobular emphysema: Secondary | ICD-10-CM | POA: Insufficient documentation

## 2012-04-02 DIAGNOSIS — F419 Anxiety disorder, unspecified: Secondary | ICD-10-CM | POA: Insufficient documentation

## 2013-05-21 DIAGNOSIS — R109 Unspecified abdominal pain: Secondary | ICD-10-CM | POA: Insufficient documentation

## 2013-09-18 DIAGNOSIS — H9201 Otalgia, right ear: Secondary | ICD-10-CM | POA: Insufficient documentation

## 2013-09-18 DIAGNOSIS — F17210 Nicotine dependence, cigarettes, uncomplicated: Secondary | ICD-10-CM | POA: Insufficient documentation

## 2013-11-18 DIAGNOSIS — R7303 Prediabetes: Secondary | ICD-10-CM | POA: Insufficient documentation

## 2014-09-01 DIAGNOSIS — S52502D Unspecified fracture of the lower end of left radius, subsequent encounter for closed fracture with routine healing: Secondary | ICD-10-CM | POA: Insufficient documentation

## 2014-10-20 ENCOUNTER — Other Ambulatory Visit: Payer: Self-pay | Admitting: Physician Assistant

## 2015-03-11 ENCOUNTER — Other Ambulatory Visit: Payer: Self-pay | Admitting: Physician Assistant

## 2016-02-15 ENCOUNTER — Other Ambulatory Visit: Payer: Self-pay | Admitting: Physician Assistant

## 2016-07-20 DIAGNOSIS — M810 Age-related osteoporosis without current pathological fracture: Secondary | ICD-10-CM | POA: Insufficient documentation

## 2016-07-27 DIAGNOSIS — M1712 Unilateral primary osteoarthritis, left knee: Secondary | ICD-10-CM | POA: Insufficient documentation

## 2017-07-23 DIAGNOSIS — M11811 Other specified crystal arthropathies, right shoulder: Secondary | ICD-10-CM

## 2017-07-23 HISTORY — DX: Other specified crystal arthropathies, right shoulder: M11.811

## 2018-04-09 DIAGNOSIS — M11811 Other specified crystal arthropathies, right shoulder: Secondary | ICD-10-CM | POA: Insufficient documentation

## 2018-05-08 ENCOUNTER — Ambulatory Visit (INDEPENDENT_AMBULATORY_CARE_PROVIDER_SITE_OTHER): Payer: Self-pay | Admitting: Orthopaedic Surgery

## 2018-06-03 ENCOUNTER — Encounter: Payer: Self-pay | Admitting: Family Medicine

## 2018-06-03 ENCOUNTER — Ambulatory Visit: Payer: BLUE CROSS/BLUE SHIELD | Admitting: Family Medicine

## 2018-06-03 VITALS — BP 111/76 | HR 86 | Temp 97.3°F | Ht 65.0 in | Wt 147.8 lb

## 2018-06-03 DIAGNOSIS — D6859 Other primary thrombophilia: Secondary | ICD-10-CM | POA: Diagnosis not present

## 2018-06-03 DIAGNOSIS — K2100 Gastro-esophageal reflux disease with esophagitis, without bleeding: Secondary | ICD-10-CM

## 2018-06-03 DIAGNOSIS — Z9049 Acquired absence of other specified parts of digestive tract: Secondary | ICD-10-CM

## 2018-06-03 DIAGNOSIS — Z8673 Personal history of transient ischemic attack (TIA), and cerebral infarction without residual deficits: Secondary | ICD-10-CM | POA: Insufficient documentation

## 2018-06-03 DIAGNOSIS — R7303 Prediabetes: Secondary | ICD-10-CM | POA: Diagnosis not present

## 2018-06-03 DIAGNOSIS — J439 Emphysema, unspecified: Secondary | ICD-10-CM | POA: Diagnosis not present

## 2018-06-03 MED ORDER — DICLOFENAC SODIUM 1 % TD GEL: 2.0000 g | Freq: Four times a day (QID) | TRANSDERMAL | 9 refills | Status: DC

## 2018-06-03 MED ORDER — FENOFIBRIC ACID 45 MG PO CPDR: DELAYED_RELEASE_CAPSULE | ORAL | 1 refills | Status: DC

## 2018-06-03 MED ORDER — ALPRAZOLAM 0.5 MG PO TABS: 0.5000 mg | ORAL_TABLET | ORAL | 5 refills | Status: DC | PRN

## 2018-06-03 MED ORDER — FLUOCINONIDE 0.05 % EX CREA: TOPICAL_CREAM | CUTANEOUS | 1 refills | Status: DC

## 2018-06-03 MED ORDER — ALBUTEROL SULFATE (2.5 MG/3ML) 0.083% IN NEBU: 2.5000 mg | INHALATION_SOLUTION | Freq: Four times a day (QID) | RESPIRATORY_TRACT | 2 refills | Status: DC | PRN

## 2018-06-03 MED ORDER — OMEPRAZOLE 40 MG PO CPDR: 40.0000 mg | DELAYED_RELEASE_CAPSULE | Freq: Every day | ORAL | 1 refills | Status: DC

## 2018-06-03 MED ORDER — OMEGA-3-ACID ETHYL ESTERS 1 G PO CAPS: 2.0000 | ORAL_CAPSULE | Freq: Two times a day (BID) | ORAL | 1 refills | Status: DC

## 2018-06-03 MED ORDER — SIMVASTATIN 20 MG PO TABS: ORAL_TABLET | ORAL | 1 refills | Status: DC

## 2018-06-03 MED ORDER — ALBUTEROL SULFATE HFA 108 (90 BASE) MCG/ACT IN AERS: INHALATION_SPRAY | RESPIRATORY_TRACT | 2 refills | Status: DC

## 2018-06-03 NOTE — Progress Notes (Signed)
Subjective:  Patient ID: Brianna Nichols, female    DOB: July 28, 1950  Age: 67 y.o. MRN: [id number]  CC: New Patient (Initial Visit)   HPI Brianna Nichols presents for new patient evaluation. She has a history of anxiety for which she estimates she takes about one xanax a week. She has also been told she is prediabetic. She tries to limit her carbohydrate intake as a result. She has a recent A1c of 6.1 available on My Chart. Chart review performed . Pt. Has been diagnosed with asthma in the past, but denies that she has it. She denies dyspnea as well. She admits to substernal pain described as pressure Present consistenty over the last two weeks. She has a history of reflux. This feels different to her. It is not radiating. Had negative MR card adenosine stress test 0n 02/07/2018 per care everywhere. Pt. Smokes 2 packs of cigarettes weekly.   GAD 7 : Generalized Anxiety Score 06/03/2018  Nervous, Anxious, on Edge 1  Control/stop worrying 1  Worry too much - different things 1  Trouble relaxing 1  Restless 1  Easily annoyed or irritable 1  Afraid - awful might happen 0  Total GAD 7 Score 6  Anxiety Difficulty Somewhat difficult      History Brianna Nichols has a past medical history of Asthma, Diverticulitis, Eczema, Hyperlipidemia, Obstructive sleep apnea, Pre-diabetes, and Psoriasis.   She has a past surgical history that includes Hysterotomy; Abdominal hysterectomy; Colon surgery; Cholecystectomy; and Larynx surgery.   Her family history includes Coronary artery disease in her sister, sister, sister, sister, and sister.She reports that she has quit smoking. Her smoking use included cigarettes. She has never used smokeless tobacco. She reports that she does not drink alcohol or use drugs.    ROS Review of Systems  Constitutional: Negative.   HENT: Negative for congestion.   Eyes: Negative for visual disturbance.  Respiratory: Negative for shortness of breath.   Cardiovascular: Positive for  chest pain. Negative for palpitations.  Gastrointestinal: Negative for abdominal pain, constipation, diarrhea, nausea and vomiting.  Genitourinary: Negative for difficulty urinating.  Musculoskeletal: Negative for arthralgias and myalgias.  Neurological: Negative for headaches.  Psychiatric/Behavioral: Negative for sleep disturbance.    Objective:  BP 111/76   Pulse 86   Temp (!) 97.3 F (36.3 C) (Oral)   Ht '5\' 5"'  (1.651 m)   Wt 147 lb 12.8 oz (67 kg)   BMI 24.60 kg/m   BP Readings from Last 3 Encounters:  06/03/18 111/76    Wt Readings from Last 3 Encounters:  06/03/18 147 lb 12.8 oz (67 kg)     Physical Exam  Constitutional: She is oriented to person, place, and time. She appears well-developed and well-nourished. No distress.  HENT:  Head: Normocephalic and atraumatic.  Right Ear: External ear normal.  Left Ear: External ear normal.  Nose: Nose normal.  Mouth/Throat: Oropharynx is clear and moist.  Eyes: Pupils are equal, round, and reactive to light. Conjunctivae and EOM are normal.  Neck: Normal range of motion. Neck supple. No thyromegaly present.  Cardiovascular: Normal rate, regular rhythm and normal heart sounds.  No murmur heard. Pulmonary/Chest: Effort normal and breath sounds normal. No respiratory distress. She has no wheezes. She has no rales.  Abdominal: Soft. Bowel sounds are normal. She exhibits no distension. There is no tenderness.  Lymphadenopathy:    She has no cervical adenopathy.  Neurological: She is alert and oriented to person, place, and time. She has normal reflexes.  Skin:  Skin is warm and dry.  Psychiatric: She has a normal mood and affect. Her behavior is normal. Judgment and thought content normal.      Assessment & Plan:   [name] was seen today for new patient (initial visit).  Diagnoses and all orders for this visit:  Prediabetes  Pulmonary emphysema, unspecified emphysema type ([place]) -     CMP14+EGFR -     CBC with  Differential/Platelet -     Ambulatory referral to Pulmonology -     CT CHEST LUNG CA SCREEN LOW DOSE W/O CM; Future  Primary hypercoagulable state ([place])  Status post laparoscopic-assisted sigmoidectomy  Gastroesophageal reflux disease with esophagitis  Other orders -     simvastatin (ZOCOR) 20 MG tablet; TAKE 1 TABLET (20 MG TOTAL) BY MOUTH DAILY. -     omeprazole (PRILOSEC) 40 MG capsule; Take 1 capsule (40 mg total) by mouth daily. -     omega-3 acid ethyl esters (LOVAZA) 1 g capsule; Take 2 capsules (2 g total) by mouth 2 (two) times daily. -     fluocinonide cream (LIDEX) 0.05 %; Apply to hand eczema twice a day as needed for rash -     diclofenac sodium (VOLTAREN) 1 % GEL; Apply 2 g topically 4 (four) times daily. -     ALPRAZolam (XANAX) 0.5 MG tablet; Take 1 tablet (0.5 mg total) by mouth as needed for anxiety. -     albuterol (PROVENTIL) (2.5 MG/3ML) 0.083% nebulizer solution; Inhale 3 mLs (2.5 mg total) into the lungs every 6 (six) hours as needed for wheezing or shortness of breath. -     albuterol (PROAIR HFA) 108 (90 Base) MCG/ACT inhaler; INHALE 2 PUFFS INTO LUNGS EVERY FOUR HOURS AS NEEDED -     Choline Fenofibrate (FENOFIBRIC ACID) 45 MG CPDR; TAKE 1 CAPSULE BY MOUTH EVERY DAY FOR HIGH CHOLESTEROL       I have discontinued Rosabelle A. Sancho's ibuprofen, ipratropium, meloxicam, and metFORMIN. I have also changed her omeprazole, omega-3 acid ethyl esters, diclofenac sodium, ALPRAZolam, and albuterol. Additionally, I am having her maintain her Cyanocobalamin (VITAMIN B-12 CR PO), Vitamin D3, triamcinolone cream, simvastatin, fluocinonide cream, albuterol, and Fenofibric Acid.  Allergies as of 06/03/2018      Reactions   Gabapentin Anaphylaxis   Morphine Other (See Comments)   Felt like body was folding over per pt Out-of-body experience   Nalbuphine Other (See Comments)   Felt like body was folding over per pt Out-of-body experience   Penicillins Anaphylaxis    anaphylaxis anaphylaxis   Diphenhydramine Hcl Other (See Comments)   Chest tightness   Diphenhydramine Hcl (sleep) Other (See Comments)   Other reaction(s): Other (See Comments) "chest tightness" "chest tightness"   Hydrocodone-acetaminophen    Other reaction(s): Other (See Comments) Ended up in ICU; Dilaudid okay.   Levofloxacin    Other reaction(s): Muscle Pain, Myalgias (intolerance) Muscular problems Muscular problems   Pollen Extract Other (See Comments)   Stuffy nose Stuffy nose   Metronidazole Itching   Facial swelling Facial swelling   Moxifloxacin Nausea Only, Other (See Comments)   Lightheaded per patient Lightheaded per patient   Sulfamethoxazole Rash   Tape Rash   rash rash      Medication List        Accurate as of 06/03/18 11:59 PM. Always use your most recent med list.          albuterol (2.5 MG/3ML) 0.083% nebulizer solution Commonly known as:  PROVENTIL Inhale 3 mLs (2.5  mg total) into the lungs every 6 (six) hours as needed for wheezing or shortness of breath.   albuterol 108 (90 Base) MCG/ACT inhaler Commonly known as:  PROVENTIL HFA;VENTOLIN HFA INHALE 2 PUFFS INTO LUNGS EVERY FOUR HOURS AS NEEDED   ALPRAZolam 0.5 MG tablet Commonly known as:  XANAX Take 1 tablet (0.5 mg total) by mouth as needed for anxiety.   diclofenac sodium 1 % Gel Commonly known as:  VOLTAREN Apply 2 g topically 4 (four) times daily.   Fenofibric Acid 45 MG Cpdr TAKE 1 CAPSULE BY MOUTH EVERY DAY FOR HIGH CHOLESTEROL   fluocinonide cream 0.05 % Commonly known as:  LIDEX Apply to hand eczema twice a day as needed for rash   omega-3 acid ethyl esters 1 g capsule Commonly known as:  LOVAZA Take 2 capsules (2 g total) by mouth 2 (two) times daily.   omeprazole 40 MG capsule Commonly known as:  PRILOSEC Take 1 capsule (40 mg total) by mouth daily.   simvastatin 20 MG tablet Commonly known as:  ZOCOR TAKE 1 TABLET (20 MG TOTAL) BY MOUTH DAILY.   triamcinolone  cream 0.1 % Commonly known as:  KENALOG Apply to hand eczema twice a day as needed for rash   VITAMIN B-12 CR PO Chew 1 gummy daily   Vitamin D3 75 MCG (3000 UT) Tabs Take by mouth.        Follow-up: Return in about 6 months (around 12/02/2018), or if symptoms worsen or fail to improve.  Claretta Fraise, M.D.

## 2018-06-03 NOTE — Patient Instructions (Signed)
Use Debrox ear drops daily     Prediabetes Eating Plan Prediabetes-also called impaired glucose tolerance or impaired fasting glucose-is a condition that causes blood sugar (blood glucose) levels to be higher than normal. Following a healthy diet can help to keep prediabetes under control. It can also help to lower the risk of type 2 diabetes and heart disease, which are increased in people who have prediabetes. Along with regular exercise, a healthy diet:  Promotes weight loss.  Helps to control blood sugar levels.  Helps to improve the way that the body uses insulin.  What do I need to know about this eating plan?  Use the glycemic index (GI) to plan your meals. The index tells you how quickly a food will raise your blood sugar. Choose low-GI foods. These foods take a longer time to raise blood sugar.  Pay close attention to the amount of carbohydrates in the food that you eat. Carbohydrates increase blood sugar levels.  Keep track of how many calories you take in. Eating the right amount of calories will help you to achieve a healthy weight. Losing about 7 percent of your starting weight can help to prevent type 2 diabetes.  You may want to follow a Mediterranean diet. This diet includes a lot of vegetables, lean meats or fish, whole grains, fruits, and healthy oils and fats. What foods can I eat? Grains Whole grains, such as whole-wheat or whole-grain breads, crackers, cereals, and pasta. Unsweetened oatmeal. Bulgur. Barley. Quinoa. Brown rice. Corn or whole-wheat flour tortillas or taco shells. Vegetables Lettuce. Spinach. Peas. Beets. Cauliflower. Cabbage. Broccoli. Carrots. Tomatoes. Squash. Eggplant. Herbs. Peppers. Onions. Cucumbers. Brussels sprouts. Fruits Berries. Bananas. Apples. Oranges. Grapes. Papaya. Mango. Pomegranate. Kiwi. Grapefruit. Cherries. Meats and Other Protein Sources Seafood. Lean meats, such as chicken and [place] or lean cuts of pork and beef. Tofu. Eggs.  Nuts. Beans. Dairy Low-fat or fat-free dairy products, such as yogurt, cottage cheese, and cheese. Beverages Water. Tea. Coffee. Sugar-free or diet soda. Seltzer water. Milk. Milk alternatives, such as soy or almond milk. Condiments Mustard. Relish. Low-fat, low-sugar ketchup. Low-fat, low-sugar barbecue sauce. Low-fat or fat-free mayonnaise. Sweets and Desserts Sugar-free or low-fat pudding. Sugar-free or low-fat ice cream and other frozen treats. Fats and Oils Avocado. Walnuts. Olive oil. The items listed above may not be a complete list of recommended foods or beverages. Contact your dietitian for more options. What foods are not recommended? Grains Refined white flour and flour products, such as bread, pasta, snack foods, and cereals. Beverages Sweetened drinks, such as sweet iced tea and soda. Sweets and Desserts Baked goods, such as cake, cupcakes, pastries, cookies, and cheesecake. The items listed above may not be a complete list of foods and beverages to avoid. Contact your dietitian for more information. This information is not intended to replace advice given to you by your health care provider. Make sure you discuss any questions you have with your health care provider. Document Released: 11/23/2014 Document Revised: 12/15/2015 Document Reviewed: 08/04/2014 Elsevier Interactive Patient Education  2017 Elsevier Inc. Diabetes Mellitus and Nutrition When you have diabetes (diabetes mellitus), it is very important to have healthy eating habits because your blood sugar (glucose) levels are greatly affected by what you eat and drink. Eating healthy foods in the appropriate amounts, at about the same times every day, can help you:  Control your blood glucose.  Lower your risk of heart disease.  Improve your blood pressure.  Reach or maintain a healthy weight.  Every person with  diabetes is different, and each person has different needs for a meal plan. Your health care  provider may recommend that you work with a diet and nutrition specialist (dietitian) to make a meal plan that is best for you. Your meal plan may vary depending on factors such as:  The calories you need.  The medicines you take.  Your weight.  Your blood glucose, blood pressure, and cholesterol levels.  Your activity level.  Other health conditions you have, such as heart or kidney disease.  How do carbohydrates affect me? Carbohydrates affect your blood glucose level more than any other type of food. Eating carbohydrates naturally increases the amount of glucose in your blood. Carbohydrate counting is a method for keeping track of how many carbohydrates you eat. Counting carbohydrates is important to keep your blood glucose at a healthy level, especially if you use insulin or take certain oral diabetes medicines. It is important to know how many carbohydrates you can safely have in each meal. This is different for every person. Your dietitian can help you calculate how many carbohydrates you should have at each meal and for snack. Foods that contain carbohydrates include:  Bread, cereal, rice, pasta, and crackers.  Potatoes and corn.  Peas, beans, and lentils.  Milk and yogurt.  Fruit and juice.  Desserts, such as cakes, cookies, ice cream, and candy.  How does alcohol affect me? Alcohol can cause a sudden decrease in blood glucose (hypoglycemia), especially if you use insulin or take certain oral diabetes medicines. Hypoglycemia can be a life-threatening condition. Symptoms of hypoglycemia (sleepiness, dizziness, and confusion) are similar to symptoms of having too much alcohol. If your health care provider says that alcohol is safe for you, follow these guidelines:  Limit alcohol intake to no more than 1 drink per day for nonpregnant women and 2 drinks per day for men. One drink equals 12 oz of beer, 5 oz of wine, or 1 oz of hard liquor.  Do not drink on an empty  stomach.  Keep yourself hydrated with water, diet soda, or unsweetened iced tea.  Keep in mind that regular soda, juice, and other mixers may contain a lot of sugar and must be counted as carbohydrates.  What are tips for following this plan? Reading food labels  Start by checking the serving size on the label. The amount of calories, carbohydrates, fats, and other nutrients listed on the label are based on one serving of the food. Many foods contain more than one serving per package.  Check the total grams (g) of carbohydrates in one serving. You can calculate the number of servings of carbohydrates in one serving by dividing the total carbohydrates by 15. For example, if a food has 30 g of total carbohydrates, it would be equal to 2 servings of carbohydrates.  Check the number of grams (g) of saturated and trans fats in one serving. Choose foods that have low or no amount of these fats.  Check the number of milligrams (mg) of sodium in one serving. Most people should limit total sodium intake to less than 2,300 mg per day.  Always check the nutrition information of foods labeled as "low-fat" or "nonfat". These foods may be higher in added sugar or refined carbohydrates and should be avoided.  Talk to your dietitian to identify your daily goals for nutrients listed on the label. Shopping  Avoid buying canned, premade, or processed foods. These foods tend to be high in fat, sodium, and added sugar.  Shop around the outside edge of the grocery store. This includes fresh fruits and vegetables, bulk grains, fresh meats, and fresh dairy. Cooking  Use low-heat cooking methods, such as baking, instead of high-heat cooking methods like deep frying.  Cook using healthy oils, such as olive, canola, or sunflower oil.  Avoid cooking with butter, cream, or high-fat meats. Meal planning  Eat meals and snacks regularly, preferably at the same times every day. Avoid going long periods of time  without eating.  Eat foods high in fiber, such as fresh fruits, vegetables, beans, and whole grains. Talk to your dietitian about how many servings of carbohydrates you can eat at each meal.  Eat 4-6 ounces of lean protein each day, such as lean meat, chicken, fish, eggs, or tofu. 1 ounce is equal to 1 ounce of meat, chicken, or fish, 1 egg, or 1/4 cup of tofu.  Eat some foods each day that contain healthy fats, such as avocado, nuts, seeds, and fish. Lifestyle   Check your blood glucose regularly.  Exercise at least 30 minutes 5 or more days each week, or as told by your health care provider.  Take medicines as told by your health care provider.  Do not use any products that contain nicotine or tobacco, such as cigarettes and e-cigarettes. If you need help quitting, ask your health care provider.  Work with a Veterinary surgeoncounselor or diabetes educator to identify strategies to manage stress and any emotional and social challenges. What are some questions to ask my health care provider?  Do I need to meet with a diabetes educator?  Do I need to meet with a dietitian?  What number can I call if I have questions?  When are the best times to check my blood glucose? Where to find more information:  American Diabetes Association: diabetes.org/food-and-fitness/food  Academy of Nutrition and Dietetics: https://www.vargas.com/www.eatright.org/resources/health/diseases-and-conditions/diabetes  [employer] of Diabetes and Digestive and Kidney Diseases (NIH): FindJewelers.czwww.niddk.nih.gov/health-information/diabetes/overview/diet-eating-physical-activity Summary  A healthy meal plan will help you control your blood glucose and maintain a healthy lifestyle.  Working with a diet and nutrition specialist (dietitian) can help you make a meal plan that is best for you.  Keep in mind that carbohydrates and alcohol have immediate effects on your blood glucose levels. It is important to count carbohydrates and to use alcohol  carefully. This information is not intended to replace advice given to you by your health care provider. Make sure you discuss any questions you have with your health care provider. Document Released: 04/05/2005 Document Revised: 08/13/2016 Document Reviewed: 08/13/2016 Elsevier Interactive Patient Education  [employer]2018 Elsevier Inc.

## 2018-06-04 LAB — CBC WITH DIFFERENTIAL/PLATELET
BASOS ABS: 0 10*3/uL (ref 0.0–0.2)
Basos: 1 %
EOS (ABSOLUTE): 0.1 10*3/uL (ref 0.0–0.4)
EOS: 2 %
HEMATOCRIT: 44.9 % (ref 34.0–46.6)
Hemoglobin: 15 g/dL (ref 11.1–15.9)
Immature Grans (Abs): 0 10*3/uL (ref 0.0–0.1)
Immature Granulocytes: 0 %
LYMPHS ABS: 1.6 10*3/uL (ref 0.7–3.1)
Lymphs: 29 %
MCH: 31.4 pg (ref 26.6–33.0)
MCHC: 33.4 g/dL (ref 31.5–35.7)
MCV: 94 fL (ref 79–97)
MONOS ABS: 0.4 10*3/uL (ref 0.1–0.9)
Monocytes: 6 %
NEUTROS ABS: 3.5 10*3/uL (ref 1.4–7.0)
Neutrophils: 62 %
Platelets: 353 10*3/uL (ref 150–450)
RBC: 4.78 x10E6/uL (ref 3.77–5.28)
RDW: 12.8 % (ref 12.3–15.4)
WBC: 5.6 10*3/uL (ref 3.4–10.8)

## 2018-06-04 LAB — CMP14+EGFR
ALBUMIN: 4.3 g/dL (ref 3.6–4.8)
ALK PHOS: 86 IU/L (ref 39–117)
ALT: 17 IU/L (ref 0–32)
AST: 20 IU/L (ref 0–40)
Albumin/Globulin Ratio: 1.5 (ref 1.2–2.2)
BILIRUBIN TOTAL: 0.2 mg/dL (ref 0.0–1.2)
BUN / CREAT RATIO: 13 (ref 12–28)
BUN: 8 mg/dL (ref 8–27)
CHLORIDE: 101 mmol/L (ref 96–106)
CO2: 24 mmol/L (ref 20–29)
Calcium: 10.2 mg/dL (ref 8.7–10.3)
Creatinine, Ser: 0.62 mg/dL (ref 0.57–1.00)
GFR calc non Af Amer: 94 mL/min/{1.73_m2} (ref >59)
GFR, EST AFRICAN AMERICAN: 109 mL/min/{1.73_m2} (ref >59)
GLOBULIN, TOTAL: 2.9 g/dL (ref 1.5–4.5)
Glucose: 86 mg/dL (ref 65–99)
Potassium: 4.9 mmol/L (ref 3.5–5.2)
SODIUM: 141 mmol/L (ref 134–144)
Total Protein: 7.2 g/dL (ref 6.0–8.5)

## 2018-06-05 ENCOUNTER — Encounter: Payer: Self-pay | Admitting: Family Medicine

## 2018-06-05 ENCOUNTER — Ambulatory Visit (INDEPENDENT_AMBULATORY_CARE_PROVIDER_SITE_OTHER): Payer: Self-pay | Admitting: Orthopaedic Surgery

## 2018-07-01 ENCOUNTER — Ambulatory Visit (HOSPITAL_COMMUNITY)
Admission: RE | Admit: 2018-07-01 | Discharge: 2018-07-01 | Disposition: A | Discharge status: Home / Self Care | Payer: BLUE CROSS/BLUE SHIELD | Source: Ambulatory Visit | Attending: Family Medicine | Admitting: Family Medicine

## 2018-07-01 DIAGNOSIS — J439 Emphysema, unspecified: Secondary | ICD-10-CM | POA: Diagnosis present

## 2018-07-08 ENCOUNTER — Encounter: Payer: Self-pay | Admitting: Family Medicine

## 2018-07-08 ENCOUNTER — Ambulatory Visit (INDEPENDENT_AMBULATORY_CARE_PROVIDER_SITE_OTHER): Payer: BLUE CROSS/BLUE SHIELD | Admitting: Family Medicine

## 2018-07-08 VITALS — BP 122/76 | HR 91 | Temp 97.6°F | Ht 65.0 in | Wt 147.8 lb

## 2018-07-08 DIAGNOSIS — J029 Acute pharyngitis, unspecified: Secondary | ICD-10-CM

## 2018-07-08 DIAGNOSIS — J40 Bronchitis, not specified as acute or chronic: Secondary | ICD-10-CM | POA: Diagnosis not present

## 2018-07-08 DIAGNOSIS — J329 Chronic sinusitis, unspecified: Secondary | ICD-10-CM | POA: Diagnosis not present

## 2018-07-08 LAB — RAPID STREP SCREEN (MED CTR MEBANE ONLY): STREP GP A AG, IA W/REFLEX: NEGATIVE

## 2018-07-08 LAB — CULTURE, GROUP A STREP

## 2018-07-08 MED ORDER — AZITHROMYCIN 250 MG PO TABS: ORAL_TABLET | ORAL | 0 refills | Status: DC

## 2018-07-08 NOTE — Progress Notes (Signed)
Chief Complaint  Patient presents with  . Cough    x 3-4 days  . Nasal Congestion  . sneezing  . Sore Throat    HPI  Patient presents today for Patient presents with upper respiratory congestion. Rhinorrhea that is  purulent. There is severe sore throat. Patient reports coughing frequently as well.  No sputum noted. There is no fever, chills or sweats. The patient denies being short of breath. Onset was 3-5 days ago. Gradually worsening. Tried OTCs without improvement.  PMH: Smoking status noted ROS: Per HPI  Objective: BP 122/76   Pulse 91   Temp 97.6 F (36.4 C) (Oral)   Ht 5\' 5"  (1.651 m)   Wt 147 lb 12.8 oz (67 kg)   SpO2 96%   BMI 24.60 kg/m  Gen: NAD, alert, cooperative with exam HEENT: NCAT, Nasal passages swollen, red  Pharynx erythematous.  CV: RRR, good S1/S2, no murmur Resp: Bronchitis changes with scattered wheezes, non-labored Ext: No edema, warm Neuro: Alert and oriented, No gross deficits  Assessment and plan:  1. Sinobronchitis   2. Sore throat     Meds ordered this encounter  Medications  . azithromycin (ZITHROMAX Z-PAK) 250 MG tablet    Sig: Take two right away Then one a day for the next 4 days.    Dispense:  6 each    Refill:  0    Orders Placed This Encounter  Procedures  . Rapid Strep Screen ([facility] ONLY)    Follow up as needed.  Mechele ClaudeWarren Emilee Market, MD

## 2018-07-08 NOTE — Patient Instructions (Signed)
Use OTC debrox for earwax removal

## 2018-07-14 ENCOUNTER — Telehealth: Payer: Self-pay | Admitting: Family Medicine

## 2018-07-14 NOTE — Telephone Encounter (Signed)
Pharmacy [facility]  Was seen last week given zpack and it has helped some, but pt states it has been making her sick to stomach, states she doesn't do well with penicillin wants to know if we can send in some type of steroid in.

## 2018-07-17 ENCOUNTER — Telehealth: Payer: Self-pay | Admitting: Family Medicine

## 2018-07-17 ENCOUNTER — Other Ambulatory Visit: Payer: Self-pay | Admitting: Family Medicine

## 2018-07-17 MED ORDER — PREDNISONE 10 MG PO TABS: ORAL_TABLET | ORAL | 0 refills | Status: DC

## 2018-07-17 MED ORDER — MINOCYCLINE HCL 100 MG PO CAPS: 100.0000 mg | ORAL_CAPSULE | Freq: Two times a day (BID) | ORAL | 0 refills | Status: DC

## 2018-07-17 NOTE — Telephone Encounter (Signed)
Left pt a detailed VM of Pred sent in

## 2018-07-17 NOTE — Telephone Encounter (Signed)
Attempted to contact patient - no call back - this encounter will be closed.  

## 2018-07-17 NOTE — Telephone Encounter (Signed)
Pt is calling again - she is worse and really wants steroids called in - the Zpak hasnt helped.  Please answer and send back to Brianna Batten, LPN -= I will call pt back personally   She is upset that she didn't get a response on 12/23-jhb

## 2018-07-17 NOTE — Telephone Encounter (Signed)
I sent in the requested prescription 

## 2018-07-17 NOTE — Telephone Encounter (Signed)
Pt aware.

## 2018-07-17 NOTE — Telephone Encounter (Signed)
Not accepting calls at this time. Did resend the 2 rx's from today to the correct pharmacy.

## 2018-07-29 ENCOUNTER — Telehealth: Payer: Self-pay | Admitting: Family Medicine

## 2018-07-31 ENCOUNTER — Telehealth: Payer: Self-pay | Admitting: Pulmonary Disease

## 2018-07-31 ENCOUNTER — Encounter: Payer: Self-pay | Admitting: Pulmonary Disease

## 2018-07-31 ENCOUNTER — Ambulatory Visit (INDEPENDENT_AMBULATORY_CARE_PROVIDER_SITE_OTHER): Payer: BLUE CROSS/BLUE SHIELD | Admitting: Pulmonary Disease

## 2018-07-31 VITALS — BP 122/80 | HR 78 | Ht 65.0 in | Wt 151.4 lb

## 2018-07-31 DIAGNOSIS — R0600 Dyspnea, unspecified: Secondary | ICD-10-CM

## 2018-07-31 DIAGNOSIS — Z87891 Personal history of nicotine dependence: Secondary | ICD-10-CM | POA: Diagnosis not present

## 2018-07-31 DIAGNOSIS — R05 Cough: Secondary | ICD-10-CM | POA: Diagnosis not present

## 2018-07-31 MED ORDER — UMECLIDINIUM-VILANTEROL 62.5-25 MCG/INH IN AEPB: 1.0000 | INHALATION_SPRAY | Freq: Every day | RESPIRATORY_TRACT | 0 refills | Status: DC

## 2018-07-31 NOTE — Patient Instructions (Addendum)
Thank you for visiting Dr. Tonia Brooms at [facility].  Today we recommend the following:  Orders Placed This Encounter  Procedures  . Pulmonary function test   Meds ordered this encounter  Medications  . umeclidinium-vilanterol (ANORO ELLIPTA) 62.5-25 MCG/INH AEPB    Sig: Inhale 1 puff into the lungs daily.    Dispense:  2 each    Refill:  0   Return in about 2 weeks (around 08/14/2018). Please schedule with NP.

## 2018-07-31 NOTE — Progress Notes (Signed)
Patient seen in the office today and instructed on use of Anoro.  Patient expressed understanding and demonstrated technique. 

## 2018-07-31 NOTE — Telephone Encounter (Signed)
I think pt husband left, he was the one that wanted to see if wife could be seen eariler I explained to him that there were no openings today seeing that wife was a new pt but I would put msg back. He was in here before 8 and I explained that no nurses or dr would be in til 8:30 he must have got tired of waiting and you may want to call him.Caren GriffinsStanley A Dalton

## 2018-07-31 NOTE — Telephone Encounter (Signed)
Spoke with pt, she informed that she will be back for her appt at 4pm. Nothing further is needed.

## 2018-07-31 NOTE — Progress Notes (Signed)
Synopsis: Referred in January 2020 for shortness of breath, cough by Mechele Claude, MD  Subjective:   PATIENT ID: Brianna Nichols GENDER: female DOB: 1951-01-27, MRN: [id number]  Chief Complaint  Patient presents with  . Consult    States she has been having a stinging sensation in her chest for the past month. States she does most of her coughing in the morning and it typically makes her gag. Smokes a pack per week.     PMH asthma, OSA on CPAP, psoriasis, eczema. Past 2 months with increased shortness of breath with associated coughing in the morning. Warm water and coffee in the morning makes it better. Smoker, started at 68 years old - 68 years, 1 ppd, 40 years. 07/01/2018 LDCT lung cancer screening, 3.6 mm nodule, plans for 12 month follow up. PFTs many years ago. None recent. Does use albuterol inhaler as needed. 3-4 times per week.  Patient denies hemoptysis.  She does occasionally have brown sputum production.   Past Medical History:  Diagnosis Date  . Asthma   . Cataract   . Diverticulitis   . Eczema   . Hyperlipidemia   . Obstructive sleep apnea   . Pre-diabetes   . Psoriasis      Family History  Problem Relation Age of Onset  . Coronary artery disease Sister   . Coronary artery disease Sister   . Coronary artery disease Sister   . Coronary artery disease Sister   . Coronary artery disease Sister      Past Surgical History:  Procedure Laterality Date  . ABDOMINAL HYSTERECTOMY    . CHOLECYSTECTOMY    . COLON SURGERY    . EYE SURGERY    . HYSTEROTOMY    . LARYNX SURGERY      Social History   Socioeconomic History  . Marital status: Married    Spouse name: Not on file  . Number of children: Not on file  . Years of education: Not on file  . Highest education level: Not on file  Occupational History  . Not on file  Social Needs  . Financial resource strain: Not on file  . Food insecurity:    Worry: Not on file    Inability: Not on file  .  Transportation needs:    Medical: Not on file    Non-medical: Not on file  Tobacco Use  . Smoking status: Former Smoker    Types: Cigarettes  . Smokeless tobacco: Never Used  Substance and Sexual Activity  . Alcohol use: No  . Drug use: No  . Sexual activity: Not Currently  Lifestyle  . Physical activity:    Days per week: Not on file    Minutes per session: Not on file  . Stress: Not on file  Relationships  . Social connections:    Talks on phone: Not on file    Gets together: Not on file    Attends religious service: Not on file    Active member of club or organization: Not on file    Attends meetings of clubs or organizations: Not on file    Relationship status: Not on file  . Intimate partner violence:    Fear of current or ex partner: Not on file    Emotionally abused: Not on file    Physically abused: Not on file    Forced sexual activity: Not on file  Other Topics Concern  . Not on file  Social History Narrative  . Not on file  Allergies  Allergen Reactions  . Gabapentin Anaphylaxis  . Morphine Other (See Comments)    Felt like body was folding over per pt Out-of-body experience   . Nalbuphine Other (See Comments)    Felt like body was folding over per pt Out-of-body experience   . Penicillins Anaphylaxis    anaphylaxis anaphylaxis   . Diphenhydramine Hcl Other (See Comments)    Chest tightness  . Diphenhydramine Hcl (Sleep) Other (See Comments)    Other reaction(s): Other (See Comments) "chest tightness" "chest tightness"   . Hydrocodone-Acetaminophen     Other reaction(s): Other (See Comments) Ended up in ICU; Dilaudid okay.  . Levofloxacin     Other reaction(s): Muscle Pain, Myalgias (intolerance) Muscular problems Muscular problems   . Pollen Extract Other (See Comments)    Stuffy nose Stuffy nose   . Metronidazole Itching    Facial swelling Facial swelling   . Moxifloxacin Nausea Only and Other (See Comments)    Lightheaded per  patient Lightheaded per patient   . Sulfamethoxazole Rash  . Tape Rash    rash rash      Outpatient Medications Prior to Visit  Medication Sig Dispense Refill  . albuterol (PROAIR HFA) 108 (90 Base) MCG/ACT inhaler INHALE 2 PUFFS INTO LUNGS EVERY FOUR HOURS AS NEEDED 18 g 2  . albuterol (PROVENTIL) (2.5 MG/3ML) 0.083% nebulizer solution Inhale 3 mLs (2.5 mg total) into the lungs every 6 (six) hours as needed for wheezing or shortness of breath. 75 mL 2  . ALPRAZolam (XANAX) 0.5 MG tablet Take 1 tablet (0.5 mg total) by mouth as needed for anxiety. 30 tablet 5  . Cholecalciferol (VITAMIN D3) 75 MCG (3000 UT) TABS Take by mouth.    . Choline Fenofibrate (FENOFIBRIC ACID) 45 MG CPDR TAKE 1 CAPSULE BY MOUTH EVERY DAY FOR HIGH CHOLESTEROL 90 capsule 1  . citalopram (CELEXA) 20 MG tablet Take 20 mg by mouth daily.    . Cyanocobalamin (VITAMIN B-12 CR PO) Chew 1 gummy daily    . diclofenac sodium (VOLTAREN) 1 % GEL Apply 2 g topically 4 (four) times daily. 100 g 9  . fluocinonide cream (LIDEX) 0.05 % Apply to hand eczema twice a day as needed for rash 120 g 1  . omega-3 acid ethyl esters (LOVAZA) 1 g capsule Take 2 capsules (2 g total) by mouth 2 (two) times daily. 360 capsule 1  . omeprazole (PRILOSEC) 40 MG capsule Take 1 capsule (40 mg total) by mouth daily. 90 capsule 1  . simvastatin (ZOCOR) 20 MG tablet TAKE 1 TABLET (20 MG TOTAL) BY MOUTH DAILY. 90 tablet 1  . triamcinolone cream (KENALOG) 0.1 % Apply to hand eczema twice a day as needed for rash    . azithromycin (ZITHROMAX Z-PAK) 250 MG tablet Take two right away Then one a day for the next 4 days. (Patient not taking: Reported on 07/31/2018) 6 each 0  . minocycline (MINOCIN) 100 MG capsule Take 1 capsule (100 mg total) by mouth 2 (two) times daily. Take on an empty stomach (Patient not taking: Reported on 07/31/2018) 20 capsule 0  . predniSONE (DELTASONE) 10 MG tablet Take 5 daily for 2 days followed by 4,3,2 and 1 for 2 days each. (Patient  not taking: Reported on 07/31/2018) 30 tablet 0   No facility-administered medications prior to visit.     Review of Systems  Constitutional: Negative for chills, fever, malaise/fatigue and weight loss.  HENT: Negative for hearing loss, sore throat and tinnitus.  Eyes: Negative for blurred vision and double vision.  Respiratory: Positive for cough, sputum production, shortness of breath and wheezing. Negative for hemoptysis and stridor.   Cardiovascular: Negative for chest pain, palpitations, orthopnea, leg swelling and PND.  Gastrointestinal: Negative for abdominal pain, constipation, diarrhea, heartburn, nausea and vomiting.  Genitourinary: Negative for dysuria, hematuria and urgency.  Musculoskeletal: Negative for joint pain and myalgias.  Skin: Negative for itching and rash.  Neurological: Negative for dizziness, tingling, weakness and headaches.  Endo/Heme/Allergies: Negative for environmental allergies. Does not bruise/bleed easily.  Psychiatric/Behavioral: Negative for depression. The patient is not nervous/anxious and does not have insomnia.   All other systems reviewed and are negative.    Objective:  Physical Exam Vitals signs reviewed.  Constitutional:      General: She is not in acute distress.    Appearance: She is well-developed.  HENT:     Head: Normocephalic and atraumatic.     Mouth/Throat:     Pharynx: No oropharyngeal exudate.  Eyes:     Conjunctiva/sclera: Conjunctivae normal.     Pupils: Pupils are equal, round, and reactive to light.  Neck:     Vascular: No JVD.     Trachea: No tracheal deviation.     Comments: Loss of supraclavicular fat Cardiovascular:     Rate and Rhythm: Normal rate and regular rhythm.     Heart sounds: S1 normal and S2 normal.     Comments: Distant heart tones Pulmonary:     Effort: No tachypnea or accessory muscle usage.     Breath sounds: No stridor. Decreased breath sounds (throughout all lung fields) present. No wheezing,  rhonchi or rales.  Abdominal:     General: Bowel sounds are normal. There is no distension.     Palpations: Abdomen is soft.     Tenderness: There is no abdominal tenderness.  Musculoskeletal:        General: No deformity.  Skin:    General: Skin is warm and dry.     Capillary Refill: Capillary refill takes less than 2 seconds.     Findings: No rash.  Neurological:     Mental Status: She is alert and oriented to person, place, and time.  Psychiatric:        Behavior: Behavior normal.      Vitals:   07/31/18 1608  BP: 122/80  Pulse: 78  SpO2: 96%  Weight: 151 lb 6.4 oz (68.7 kg)  Height: 5\' 5"  (1.651 m)   96% on RA BMI Readings from Last 3 Encounters:  07/31/18 25.19 kg/m  07/08/18 24.60 kg/m  06/03/18 24.60 kg/m   Wt Readings from Last 3 Encounters:  07/31/18 151 lb 6.4 oz (68.7 kg)  07/08/18 147 lb 12.8 oz (67 kg)  06/03/18 147 lb 12.8 oz (67 kg)     CBC    Component Value Date/Time   WBC 5.6 06/03/2018 1421   WBC 7.4 08/24/2009 0854   RBC 4.78 06/03/2018 1421   RBC 4.49 08/24/2009 0854   HGB 15.0 06/03/2018 1421   HCT 44.9 06/03/2018 1421   PLT 353 06/03/2018 1421   MCV 94 06/03/2018 1421   MCH 31.4 06/03/2018 1421   MCHC 33.4 06/03/2018 1421   MCHC 34.8 08/24/2009 0854   RDW 12.8 06/03/2018 1421   LYMPHSABS 1.6 06/03/2018 1421   MONOABS 0.4 08/24/2009 0854   EOSABS 0.1 06/03/2018 1421   BASOSABS 0.0 06/03/2018 1421    Chest Imaging:  Low-dose lung cancer screening CT 07/01/2018: Lung RADS 2B  Small upper lobe 3.6 mm nodule.  Recommending 8741-month follow-up.  Pulmonary Functions Testing Results: No flowsheet data found.  FeNO: None   Pathology: None   Echocardiogram: None   Heart Catheterization: None     Assessment & Plan:   Dyspnea, unspecified type - Plan: Pulmonary function test  Cough  Sputum production  Former smoker  Discussion:  This is a 68 year old female with a longstanding history of tobacco abuse.  She quit  when she was 68 years old after 40 pack years of smoking.  She has a daily cough with morning sputum production.  She has been using her albuterol inhaler with some relief.  She has not been on any maintenance inhalers prior.  She likely has mild COPD.  Low-dose lung cancer screening CT that was completed this past month with areas of small centrilobular emphysema and mild paraseptal emphysema.  Recommend the following: She should abstain from any additional cigarette use.  She states she has not used cigarettes for the past year. Recommend obtaining full PFTs for evaluation of potential underlying COPD. Patient can return to clinic in 2 weeks following this. We will give her a sample of Anoro Ellipta.  If she does well with this we will be glad to send a prescription to the pharmacy. She can return in 2 weeks to see 1 of our nurse practitioners to review her pulmonary function tests.  She will need enrollment in our lung cancer screening program. We can get her an appointment for this and a repeat LD CT to be done in December 2020.  Rater than 50% of this patient 60-minute office visit was spent counseling on the above recommendations and treatment plan as well as reviewing her CAT scan imaging face-to-face in the room along with her husband.    Current Outpatient Medications:  .  albuterol (PROAIR HFA) 108 (90 Base) MCG/ACT inhaler, INHALE 2 PUFFS INTO LUNGS EVERY FOUR HOURS AS NEEDED, Disp: 18 g, Rfl: 2 .  albuterol (PROVENTIL) (2.5 MG/3ML) 0.083% nebulizer solution, Inhale 3 mLs (2.5 mg total) into the lungs every 6 (six) hours as needed for wheezing or shortness of breath., Disp: 75 mL, Rfl: 2 .  ALPRAZolam (XANAX) 0.5 MG tablet, Take 1 tablet (0.5 mg total) by mouth as needed for anxiety., Disp: 30 tablet, Rfl: 5 .  Cholecalciferol (VITAMIN D3) 75 MCG (3000 UT) TABS, Take by mouth., Disp: , Rfl:  .  Choline Fenofibrate (FENOFIBRIC ACID) 45 MG CPDR, TAKE 1 CAPSULE BY MOUTH EVERY DAY FOR  HIGH CHOLESTEROL, Disp: 90 capsule, Rfl: 1 .  citalopram (CELEXA) 20 MG tablet, Take 20 mg by mouth daily., Disp: , Rfl:  .  Cyanocobalamin (VITAMIN B-12 CR PO), Chew 1 gummy daily, Disp: , Rfl:  .  diclofenac sodium (VOLTAREN) 1 % GEL, Apply 2 g topically 4 (four) times daily., Disp: 100 g, Rfl: 9 .  fluocinonide cream (LIDEX) 0.05 %, Apply to hand eczema twice a day as needed for rash, Disp: 120 g, Rfl: 1 .  omega-3 acid ethyl esters (LOVAZA) 1 g capsule, Take 2 capsules (2 g total) by mouth 2 (two) times daily., Disp: 360 capsule, Rfl: 1 .  omeprazole (PRILOSEC) 40 MG capsule, Take 1 capsule (40 mg total) by mouth daily., Disp: 90 capsule, Rfl: 1 .  simvastatin (ZOCOR) 20 MG tablet, TAKE 1 TABLET (20 MG TOTAL) BY MOUTH DAILY., Disp: 90 tablet, Rfl: 1 .  triamcinolone cream (KENALOG) 0.1 %, Apply to hand eczema twice a day as needed  for rash, Disp: , Rfl:    Josephine IgoBradley L Efren Kross, DO [facility] 07/31/2018 4:16 PM

## 2018-08-21 ENCOUNTER — Telehealth: Payer: Self-pay | Admitting: Family Medicine

## 2018-08-21 NOTE — Telephone Encounter (Signed)
Requesting doctor to call for insurance purpose. Please advise

## 2018-08-22 NOTE — Telephone Encounter (Signed)
There are results in the 05-08-18 telephone encounter from her previous PCP. Can we use these readings or do we needs report from [facility]? It looks like they ordered the CPAP and a month after transferred care to [place]. Please advise

## 2018-08-22 NOTE — Telephone Encounter (Signed)
I do not have the results of a sleep study. That is essential for the review. Please check with the patient to see if she can help [place] with a report or tell us when and where it was done. (Could not locate one on Care Everywhere.)

## 2018-08-26 ENCOUNTER — Encounter: Payer: Self-pay | Admitting: Primary Care

## 2018-08-26 ENCOUNTER — Ambulatory Visit (INDEPENDENT_AMBULATORY_CARE_PROVIDER_SITE_OTHER): Payer: BLUE CROSS/BLUE SHIELD | Admitting: Pulmonary Disease

## 2018-08-26 ENCOUNTER — Ambulatory Visit (INDEPENDENT_AMBULATORY_CARE_PROVIDER_SITE_OTHER): Payer: BLUE CROSS/BLUE SHIELD | Admitting: Primary Care

## 2018-08-26 VITALS — BP 118/74 | HR 82 | Ht 65.0 in | Wt 144.0 lb

## 2018-08-26 DIAGNOSIS — R12 Heartburn: Secondary | ICD-10-CM | POA: Diagnosis not present

## 2018-08-26 DIAGNOSIS — R0600 Dyspnea, unspecified: Secondary | ICD-10-CM | POA: Diagnosis not present

## 2018-08-26 DIAGNOSIS — F17210 Nicotine dependence, cigarettes, uncomplicated: Secondary | ICD-10-CM | POA: Diagnosis not present

## 2018-08-26 DIAGNOSIS — F17200 Nicotine dependence, unspecified, uncomplicated: Secondary | ICD-10-CM | POA: Diagnosis not present

## 2018-08-26 DIAGNOSIS — R0982 Postnasal drip: Secondary | ICD-10-CM

## 2018-08-26 DIAGNOSIS — J432 Centrilobular emphysema: Secondary | ICD-10-CM

## 2018-08-26 LAB — PULMONARY FUNCTION TEST
DL/VA % pred: 83 %
DL/VA: 3.44 ml/min/mmHg/L
DLCO unc % pred: 69 %
DLCO unc: 14.13 ml/min/mmHg
FEF 25-75 Post: 2.23 L/sec
FEF 25-75 Pre: 1.58 L/sec
FEF2575-%Change-Post: 41 %
FEF2575-%Pred-Post: 106 %
FEF2575-%Pred-Pre: 75 %
FEV1-%CHANGE-POST: 8 %
FEV1-%Pred-Post: 78 %
FEV1-%Pred-Pre: 72 %
FEV1-POST: 1.92 L
FEV1-Pre: 1.77 L
FEV1FVC-%Change-Post: -1 %
FEV1FVC-%Pred-Pre: 102 %
FEV6-%Change-Post: 9 %
FEV6-%PRED-POST: 80 %
FEV6-%Pred-Pre: 72 %
FEV6-Post: 2.47 L
FEV6-Pre: 2.25 L
FEV6FVC-%Change-Post: 0 %
FEV6FVC-%Pred-Post: 104 %
FEV6FVC-%Pred-Pre: 104 %
FVC-%Change-Post: 9 %
FVC-%PRED-POST: 76 %
FVC-%Pred-Pre: 70 %
FVC-Post: 2.47 L
FVC-Pre: 2.25 L
POST FEV1/FVC RATIO: 78 %
Post FEV6/FVC ratio: 100 %
Pre FEV1/FVC ratio: 79 %
Pre FEV6/FVC Ratio: 100 %

## 2018-08-26 NOTE — Assessment & Plan Note (Addendum)
-   Advised flonase and Claritin daily as needed

## 2018-08-26 NOTE — Assessment & Plan Note (Signed)
-   Quit smoking in Jan 2020 - Lung RADS 2  - Continue annual low-dose lung cancer screenings (due Dec 2020)

## 2018-08-26 NOTE — Progress Notes (Signed)
@Patient  ID: Brianna Nichols, female    DOB: 1951-06-02, 68 y.o.   MRN: [id number]  Chief Complaint  Patient presents with  . Follow-up    PFT today.     Referring provider: Mechele Claude, MD  HPI: 68 year old female, former smoker quit Jan 2020 (40 pack year hx). PMH significant for asthma, centrilobular emphysema, OSA. Patient of Dr. Tonia Brooms, seen for initial consul on 07/31/18 for shortness of breath and morning cough. LDCT 07/01/18 showed Lung RADS 2 (small upper lobe 3.82mm nodule), plan 12 month follow-up.   08/26/2018  Patient presents today for 3-4 week follow-up visit with PFTs. She is doing very well, has not required her rescue inhaler in the last month. Occasionally uses it for wheezing or at night but very rarely. She did not notice any improvement with Anoro sample. She does not get out of breath walking or doing daily activities. Feels her cough is d/t post-nasal drip which is worse in the morning and clears during the day. She has not tried any over the counter medications for this. Has burning/GERD symptoms. Certain foods triggers. Does not take prilosec everyday, when she does take it helps. Denies fever or shortness of breath.   PFTs 08/26/2018 - FVC 2.47 (76%), FEV1 1.92 (78%), ratio 78, DLCOunc 69%. Slight curvature on flow volume loop   Allergies  Allergen Reactions  . Gabapentin Anaphylaxis  . Morphine Other (See Comments)    Felt like body was folding over per pt Out-of-body experience   . Nalbuphine Other (See Comments)    Felt like body was folding over per pt Out-of-body experience   . Penicillins Anaphylaxis    anaphylaxis anaphylaxis   . Diphenhydramine Hcl Other (See Comments)    Chest tightness  . Diphenhydramine Hcl (Sleep) Other (See Comments)    Other reaction(s): Other (See Comments) "chest tightness" "chest tightness"   . Hydrocodone-Acetaminophen     Other reaction(s): Other (See Comments) Ended up in ICU; Dilaudid okay.  . Levofloxacin    Other reaction(s): Muscle Pain, Myalgias (intolerance) Muscular problems Muscular problems   . Pollen Extract Other (See Comments)    Stuffy nose Stuffy nose   . Metronidazole Itching    Facial swelling Facial swelling   . Moxifloxacin Nausea Only and Other (See Comments)    Lightheaded per patient Lightheaded per patient   . Sulfamethoxazole Rash  . Tape Rash    rash rash     Immunization History  Administered Date(s) Administered  . Tdap 03/30/2010    Past Medical History:  Diagnosis Date  . Asthma   . Cataract   . Diverticulitis   . Eczema   . Hyperlipidemia   . Obstructive sleep apnea   . Pre-diabetes   . Psoriasis     Tobacco History: Social History   Tobacco Use  Smoking Status Former Smoker  . Types: Cigarettes  Smokeless Tobacco Never Used   Counseling given: Not Answered   Outpatient Medications Prior to Visit  Medication Sig Dispense Refill  . albuterol (PROAIR HFA) 108 (90 Base) MCG/ACT inhaler INHALE 2 PUFFS INTO LUNGS EVERY FOUR HOURS AS NEEDED 18 g 2  . albuterol (PROVENTIL) (2.5 MG/3ML) 0.083% nebulizer solution Inhale 3 mLs (2.5 mg total) into the lungs every 6 (six) hours as needed for wheezing or shortness of breath. 75 mL 2  . ALPRAZolam (XANAX) 0.5 MG tablet Take 1 tablet (0.5 mg total) by mouth as needed for anxiety. 30 tablet 5  . Cholecalciferol (VITAMIN D3)  75 MCG (3000 UT) TABS Take by mouth.    . Choline Fenofibrate (FENOFIBRIC ACID) 45 MG CPDR TAKE 1 CAPSULE BY MOUTH EVERY DAY FOR HIGH CHOLESTEROL 90 capsule 1  . citalopram (CELEXA) 20 MG tablet Take 20 mg by mouth daily.    . Cyanocobalamin (VITAMIN B-12 CR PO) Chew 1 gummy daily    . diclofenac sodium (VOLTAREN) 1 % GEL Apply 2 g topically 4 (four) times daily. 100 g 9  . fluocinonide cream (LIDEX) 0.05 % Apply to hand eczema twice a day as needed for rash 120 g 1  . omega-3 acid ethyl esters (LOVAZA) 1 g capsule Take 2 capsules (2 g total) by mouth 2 (two) times daily. 360  capsule 1  . omeprazole (PRILOSEC) 40 MG capsule Take 1 capsule (40 mg total) by mouth daily. 90 capsule 1  . simvastatin (ZOCOR) 20 MG tablet TAKE 1 TABLET (20 MG TOTAL) BY MOUTH DAILY. 90 tablet 1  . triamcinolone cream (KENALOG) 0.1 % Apply to hand eczema twice a day as needed for rash    . umeclidinium-vilanterol (ANORO ELLIPTA) 62.5-25 MCG/INH AEPB Inhale 1 puff into the lungs daily. 2 each 0   No facility-administered medications prior to visit.      Review of Systems  Review of Systems  Constitutional: Negative.   HENT: Positive for postnasal drip.   Respiratory: Positive for cough. Negative for chest tightness, shortness of breath and wheezing.   Cardiovascular: Negative.     Physical Exam  BP 118/74   Pulse 82   Ht 5\' 5"  (1.651 m)   Wt 144 lb (65.3 kg)   SpO2 93%   BMI 23.96 kg/m  Physical Exam Constitutional:      Appearance: Normal appearance.  HENT:     Right Ear: Tympanic membrane normal.     Left Ear: Tympanic membrane normal.     Mouth/Throat:     Mouth: Mucous membranes are moist.     Pharynx: Oropharynx is clear.  Eyes:     Extraocular Movements: Extraocular movements intact.     Pupils: Pupils are equal, round, and reactive to light.  Neck:     Musculoskeletal: Normal range of motion and neck supple.  Cardiovascular:     Rate and Rhythm: Normal rate and regular rhythm.  Pulmonary:     Effort: Pulmonary effort is normal.     Breath sounds: Normal breath sounds.     Comments: LSC Skin:    General: Skin is warm and dry.  Neurological:     General: No focal deficit present.     Mental Status: She is alert and oriented to person, place, and time. Mental status is at baseline.  Psychiatric:        Mood and Affect: Mood normal.        Behavior: Behavior normal.        Thought Content: Thought content normal.        Judgment: Judgment normal.      Lab Results:  CBC    Component Value Date/Time   WBC 5.6 06/03/2018 1421   WBC 7.4 08/24/2009  0854   RBC 4.78 06/03/2018 1421   RBC 4.49 08/24/2009 0854   HGB 15.0 06/03/2018 1421   HCT 44.9 06/03/2018 1421   PLT 353 06/03/2018 1421   MCV 94 06/03/2018 1421   MCH 31.4 06/03/2018 1421   MCHC 33.4 06/03/2018 1421   MCHC 34.8 08/24/2009 0854   RDW 12.8 06/03/2018 1421   LYMPHSABS 1.6 06/03/2018  1421   MONOABS 0.4 08/24/2009 0854   EOSABS 0.1 06/03/2018 1421   BASOSABS 0.0 06/03/2018 1421    BMET    Component Value Date/Time   NA 141 06/03/2018 1421   K 4.9 06/03/2018 1421   CL 101 06/03/2018 1421   CO2 24 06/03/2018 1421   GLUCOSE 86 06/03/2018 1421   GLUCOSE 76 08/24/2009 0854   BUN 8 06/03/2018 1421   CREATININE 0.62 06/03/2018 1421   CALCIUM 10.2 06/03/2018 1421   GFRNONAA 94 06/03/2018 1421   GFRAA 109 06/03/2018 1421    BNP No results found for: BNP  ProBNP No results found for: PROBNP  Imaging: No results found.   Assessment & Plan:   Centrilobular emphysema (HCC) - No overt obstruction on PFTs, mild DLCO defect and curvature to flow vol loop - Asymptomatic. No shortness of breath with rest or activity.  - Does not need daily maintenance inhaler at this time - Recommend continuing Albuterol hfa as needed for wheezing/shortness of breath - FU in 1 year or if symptoms worsen   Cigarette smoker - Quit smoking in Jan 2020 - Lung RADS 2  - Continue annual low-dose lung cancer screenings (due Dec 2020)  Heartburn - Recommend GERD daily and prn Prilosec   Post-nasal drip - Advised flonase and Claritin daily as needed    Glenford BayleyElizabeth W Jaydrian Corpening, NP 08/26/2018

## 2018-08-26 NOTE — Assessment & Plan Note (Signed)
-   No overt obstruction on PFTs, mild DLCO defect and curvature to flow vol loop - Asymptomatic. No shortness of breath with rest or activity.  - Does not need daily maintenance inhaler at this time - Recommend continuing Albuterol hfa as needed for wheezing/shortness of breath - FU in 1 year or if symptoms worsen

## 2018-08-26 NOTE — Progress Notes (Signed)
PFT completed today.  

## 2018-08-26 NOTE — Patient Instructions (Addendum)
PFTs showed mild obstructive lung disease  Ok to stop Anoro  Use Albuterol rescue inhaler 2 puffs every 4-6 hours as needed for wheezing/sob  Recommendations: - Strongly encourage you to stop smoking TODAY and stay physically active - Flonase nasal spray and/or Claritin for post nasal drip symptoms   Referral: Lung cancer screening clinic (around 06/2019)  Follow up: - 1 year follow-up with Brianna Nichols  - If shortness of breath worsens or you find your self using rescue inhaler more please return sooner     Steps to Quit Smoking  Smoking tobacco can be bad for your health. It can also affect almost every organ in your body. Smoking puts you and people around you at risk for many serious long-lasting (chronic) diseases. Quitting smoking is hard, but it is one of the best things that you can do for your health. It is never too late to quit. What are the benefits of quitting smoking? When you quit smoking, you lower your risk for getting serious diseases and conditions. They can include:  Lung cancer or lung disease.  Heart disease.  Stroke.  Heart attack.  Not being able to have children (infertility).  Weak bones (osteoporosis) and broken bones (fractures). If you have coughing, wheezing, and shortness of breath, those symptoms may get better when you quit. You may also get sick less often. If you are pregnant, quitting smoking can help to lower your chances of having a baby of low birth weight. What can I do to help me quit smoking? Talk with your doctor about what can help you quit smoking. Some things you can do (strategies) include:  Quitting smoking totally, instead of slowly cutting back how much you smoke over a period of time.  Going to in-person counseling. You are more likely to quit if you go to many counseling sessions.  Using resources and support systems, such as: ? Agricultural engineer with a Veterinary surgeon. ? Phone quitlines. ? Automotive engineer. ? Support  groups or group counseling. ? Text messaging programs. ? Mobile phone apps or applications.  Taking medicines. Some of these medicines may have nicotine in them. If you are pregnant or breastfeeding, do not take any medicines to quit smoking unless your doctor says it is okay. Talk with your doctor about counseling or other things that can help you. Talk with your doctor about using more than one strategy at the same time, such as taking medicines while you are also going to in-person counseling. This can help make quitting easier. What things can I do to make it easier to quit? Quitting smoking might feel very hard at first, but there is a lot that you can do to make it easier. Take these steps:  Talk to your family and friends. Ask them to support and encourage you.  Call phone quitlines, reach out to support groups, or work with a Veterinary surgeon.  Ask people who smoke to not smoke around you.  Avoid places that make you want (trigger) to smoke, such as: ? Bars. ? Parties. ? Smoke-break areas at work.  Spend time with people who do not smoke.  Lower the stress in your life. Stress can make you want to smoke. Try these things to help your stress: ? Getting regular exercise. ? Deep-breathing exercises. ? Yoga. ? Meditating. ? Doing a body scan. To do this, close your eyes, focus on one area of your body at a time from head to toe, and notice which parts of your body  are tense. Try to relax the muscles in those areas.  Download or buy apps on your mobile phone or tablet that can help you stick to your quit plan. There are many free apps, such as QuitGuide from the [employer] developer(Centers for Disease Control and Prevention). You can find more support from smokefree.gov and other websites. This information is not intended to replace advice given to you by your health care provider. Make sure you discuss any questions you have with your health care provider. Document Released: 05/05/2009 Document Revised:  03/06/2016 Document Reviewed: 11/23/2014 Elsevier Interactive Patient Education  2019 [employer]    Food Choices for Gastroesophageal Reflux Disease, Adult When you have gastroesophageal reflux disease (GERD), the foods you eat and your eating habits are very important. Choosing the right foods can help ease the discomfort of GERD. Consider working with a diet and nutrition specialist (dietitian) to help you make healthy food choices. What general guidelines should I follow?  Eating plan  Choose healthy foods low in fat, such as fruits, vegetables, whole grains, low-fat dairy products, and lean meat, fish, and poultry.  Eat frequent, small meals instead of three large meals each day. Eat your meals slowly, in a relaxed setting. Avoid bending over or lying down until 2-3 hours after eating.  Limit high-fat foods such as fatty meats or fried foods.  Limit your intake of oils, butter, and shortening to less than 8 teaspoons each day.  Avoid the following: ? Foods that cause symptoms. These may be different for different people. Keep a food diary to keep track of foods that cause symptoms. ? Alcohol. ? Drinking large amounts of liquid with meals. ? Eating meals during the 2-3 hours before bed.  Cook foods using methods other than frying. This may include baking, grilling, or broiling. Lifestyle  Maintain a healthy weight. Ask your health care provider what weight is healthy for you. If you need to lose weight, work with your health care provider to do so safely.  Exercise for at least 30 minutes on 5 or more days each week, or as told by your health care provider.  Avoid wearing clothes that fit tightly around your waist and chest.  Do not use any products that contain nicotine or tobacco, such as cigarettes and e-cigarettes. If you need help quitting, ask your health care provider.  Sleep with the head of your bed raised. Use a wedge under the mattress or blocks under the bed  frame to raise the head of the bed. What foods are not recommended? The items listed may not be a complete list. Talk with your dietitian about what dietary choices are best for you. Grains Pastries or quick breads with added fat. [place] toast. Vegetables Deep fried vegetables. [place] fries. Any vegetables prepared with added fat. Any vegetables that cause symptoms. For some people this may include tomatoes and tomato products, chili peppers, onions and garlic, and horseradish. Fruits Any fruits prepared with added fat. Any fruits that cause symptoms. For some people this may include citrus fruits, such as oranges, grapefruit, pineapple, and lemons. Meats and other protein foods High-fat meats, such as fatty beef or pork, hot dogs, ribs, ham, sausage, salami and bacon. Fried meat or protein, including fried fish and fried chicken. Nuts and nut butters. Dairy Whole milk and chocolate milk. Sour cream. Cream. Ice cream. Cream cheese. Milk shakes. Beverages Coffee and tea, with or without caffeine. Carbonated beverages. Sodas. Energy drinks. Fruit juice made with acidic fruits (such as  orange or grapefruit). Tomato juice. Alcoholic drinks. Fats and oils Butter. Margarine. Shortening. Ghee. Sweets and desserts Chocolate and cocoa. Donuts. Seasoning and other foods Pepper. Peppermint and spearmint. Any condiments, herbs, or seasonings that cause symptoms. For some people, this may include curry, hot sauce, or vinegar-based salad dressings. Summary  When you have gastroesophageal reflux disease (GERD), food and lifestyle choices are very important to help ease the discomfort of GERD.  Eat frequent, small meals instead of three large meals each day. Eat your meals slowly, in a relaxed setting. Avoid bending over or lying down until 2-3 hours after eating.  Limit high-fat foods such as fatty meat or fried foods. This information is not intended to replace advice given to you by your health care  provider. Make sure you discuss any questions you have with your health care provider. Document Released: 07/09/2005 Document Revised: 07/10/2016 Document Reviewed: 07/10/2016 Elsevier Interactive Patient Education  2019 [employer]

## 2018-08-26 NOTE — Assessment & Plan Note (Signed)
-   Recommend GERD daily and prn Prilosec

## 2018-08-26 NOTE — Progress Notes (Signed)
PCCM: Agree. Thanks for seeing her.  Josephine Igo, DO [facility] 08/26/2018 6:53 PM

## 2018-09-04 ENCOUNTER — Encounter: Payer: Self-pay | Admitting: Family Medicine

## 2018-09-26 ENCOUNTER — Telehealth: Payer: Self-pay | Admitting: Family Medicine

## 2018-09-26 NOTE — Telephone Encounter (Signed)
I do not see it- can you see it in care everywhere? Please advise and send back to pools.

## 2018-09-26 NOTE — Telephone Encounter (Signed)
Sleep Lab - [facility]  [address]  [place], [place] [address]  [phone]  [address]  [place], [place] [address]  [phone]    The person above contacted the patient from the sleep lab in August 2019

## 2018-09-29 NOTE — Telephone Encounter (Signed)
Left message to please call our office. 

## 2018-10-09 ENCOUNTER — Encounter: Payer: Self-pay | Admitting: Family Medicine

## 2018-10-09 ENCOUNTER — Other Ambulatory Visit: Payer: Self-pay

## 2018-10-09 ENCOUNTER — Ambulatory Visit (INDEPENDENT_AMBULATORY_CARE_PROVIDER_SITE_OTHER): Payer: PPO | Admitting: Family Medicine

## 2018-10-09 VITALS — BP 117/76 | HR 87 | Temp 98.0°F | Ht 65.0 in | Wt 150.0 lb

## 2018-10-09 DIAGNOSIS — G4730 Sleep apnea, unspecified: Secondary | ICD-10-CM

## 2018-10-09 DIAGNOSIS — G4700 Insomnia, unspecified: Secondary | ICD-10-CM

## 2018-10-09 NOTE — Progress Notes (Signed)
Subjective:  Patient ID: Brianna Nichols, female    DOB: 03-08-1951  Age: 68 y.o. MRN: [id number]  CC: Apnea (pt here today to discuss sleep apnea and for rx of CPAP)   HPI Brianna Nichols presents for follow-up of her sleep apnea.  She was evaluated last fall by Dr. Thomes Nichols in [facility] at their sleep lab.  Of note is that she was found to desat to 83% and had 38.5 events of apnea per hour.  From that Dr. Claria Nichols recommended that she have CPAP at 10 cm of water.  She is in today because she has been snoring a lot and due to various delays by a change of insurance and provider she has been without her supplies and machine.  She tells me that in the past she slept quite well when she had her CPAP.  Fortunately she is now sleeping poorly and snoring heavily and her sleep is not refreshing.  Attempts to get records have been done on 3 occasions with Dr. sleep Brianna Nichols and have been unsuccessful.  Of note is that she was recommended to have an FNP reviewed at (XS/S) nasal pillow with chinstrap.  She needs a heated humidifier and refill of her supplies every 3 months and as needed.  Diagnosis code was G 47.30.  Patient tells me she started wheezing some since she has been without her CPAP.  Depression screen PHQ 2/9 07/08/2018  Decreased Interest 0  Down, Depressed, Hopeless 0  PHQ - 2 Score 0    History Brianna Nichols has a past medical history of Asthma, Cataract, Diverticulitis, Eczema, Hyperlipidemia, Obstructive sleep apnea, Pre-diabetes, and Psoriasis.   She has a past surgical history that includes Hysterotomy; Abdominal hysterectomy; Colon surgery; Cholecystectomy; Larynx surgery; and Eye surgery.   Her family history includes Coronary artery disease in her sister, sister, sister, sister, and sister.She reports that she has quit smoking. Her smoking use included cigarettes. She has never used smokeless tobacco. She reports that she does not drink alcohol or use drugs.    ROS Review of Systems   Constitutional: Negative for appetite change, chills, diaphoresis, fatigue and fever.  HENT: Negative for congestion, ear pain, hearing loss, postnasal drip, rhinorrhea, sore throat and trouble swallowing.   Respiratory: Positive for cough and wheezing. Negative for chest tightness and shortness of breath.   Cardiovascular: Negative for chest pain and palpitations.  Gastrointestinal: Negative for abdominal pain.  Musculoskeletal: Negative for arthralgias.  Skin: Negative for rash.    Objective:  BP 117/76   Pulse 87   Temp 98 F (36.7 C) (Oral)   Ht  (1.651 m)   Wt 150 lb (68 kg)   BMI 24.96 kg/m   BP Readings from Last 3 Encounters:  10/09/18 117/76  08/26/18 118/74  07/31/18 122/80    Wt Readings from Last 3 Encounters:  10/09/18 150 lb (68 kg)  08/26/18 144 lb (65.3 kg)  07/31/18 151 lb 6.4 oz (68.7 kg)     Physical Exam Constitutional:      General: She is not in acute distress.    Appearance: She is well-developed.  Cardiovascular:     Rate and Rhythm: Normal rate and regular rhythm.  Pulmonary:     Breath sounds: Normal breath sounds.  Skin:    General: Skin is warm and dry.  Neurological:     Mental Status: She is alert and oriented to person, place, and time.       Assessment & Plan:  Brianna Nichols was seen today for apnea.  Diagnoses and all orders for this visit:  Insomnia with sleep apnea -     For home use only DME continuous positive airway pressure (CPAP)       I have discontinued Brianna Nichols's triamcinolone cream and umeclidinium-vilanterol. I am also having her maintain her Cyanocobalamin (VITAMIN B-12 CR PO), Vitamin D3, simvastatin, omeprazole, omega-3 acid ethyl esters, fluocinonide cream, diclofenac sodium, ALPRAZolam, albuterol, albuterol, Fenofibric Acid, and citalopram.  Allergies as of 10/09/2018      Reactions   Gabapentin Anaphylaxis   Morphine Other (See Comments)   Felt like body was folding over per pt Out-of-body  experience   Nalbuphine Other (See Comments)   Felt like body was folding over per pt Out-of-body experience   Penicillins Anaphylaxis   anaphylaxis anaphylaxis   Diphenhydramine Hcl Other (See Comments)   Chest tightness   Diphenhydramine Hcl (sleep) Other (See Comments)   Other reaction(s): Other (See Comments) "chest tightness" "chest tightness"   Hydrocodone-acetaminophen    Other reaction(s): Other (See Comments) Ended up in ICU; Dilaudid okay.   Levofloxacin    Other reaction(s): Muscle Pain, Myalgias (intolerance) Muscular problems Muscular problems   Pollen Extract Other (See Comments)   Stuffy nose Stuffy nose   Metronidazole Itching   Facial swelling Facial swelling   Moxifloxacin Nausea Only, Other (See Comments)   Lightheaded per patient Lightheaded per patient   Sulfamethoxazole Rash   Tape Rash   rash rash      Medication List       Accurate as of October 09, 2018 11:00 AM. Always use your most recent med list.        albuterol (2.5 MG/3ML) 0.083% nebulizer solution Commonly known as:  PROVENTIL Inhale 3 mLs (2.5 mg total) into the lungs every 6 (six) hours as needed for wheezing or shortness of breath.   albuterol 108 (90 Base) MCG/ACT inhaler Commonly known as:  ProAir HFA INHALE 2 PUFFS INTO LUNGS EVERY FOUR HOURS AS NEEDED   ALPRAZolam 0.5 MG tablet Commonly known as:  XANAX Take 1 tablet (0.5 mg total) by mouth as needed for anxiety.   citalopram 20 MG tablet Commonly known as:  CELEXA Take 20 mg by mouth daily.   diclofenac sodium 1 % Gel Commonly known as:  VOLTAREN Apply 2 g topically 4 (four) times daily.   Fenofibric Acid 45 MG Cpdr TAKE 1 CAPSULE BY MOUTH EVERY DAY FOR HIGH CHOLESTEROL   fluocinonide cream 0.05 % Commonly known as:  LIDEX Apply to hand eczema twice a day as needed for rash   omega-3 acid ethyl esters 1 g capsule Commonly known as:  LOVAZA Take 2 capsules (2 g total) by mouth 2 (two) times daily.    omeprazole 40 MG capsule Commonly known as:  PRILOSEC Take 1 capsule (40 mg total) by mouth daily.   simvastatin 20 MG tablet Commonly known as:  ZOCOR TAKE 1 TABLET (20 MG TOTAL) BY MOUTH DAILY.   VITAMIN B-12 CR PO Chew 1 gummy daily   Vitamin D3 75 MCG (3000 UT) Tabs Take by mouth.            Durable Medical Equipment  (From admission, onward)         Start     Ordered   10/09/18 0000  For home use only DME continuous positive airway pressure (CPAP)    Comments:  Recommendations from her sleep study include CPAP machine set to 10 cm H2O Mask  F&P Brevida (XS-S) nasal pillow with chinstrap, heated humidifier Supplies refilled every 3 months or as needed DX G4 7.30  Question Answer Comment  Patient has OSA or probable OSA Yes   Is the patient currently using CPAP in the home No   If no (to question two) date of sleep study 04/2018   Date of face to face encounter 10/09/2018   Settings 5-10   Signs and symptoms of probable OSA  (select all that apply) Snoring   Signs and symptoms of probable OSA  (select all that apply) Witnessed apneas   CPAP supplies needed Mask, headgear, cushions, filters, heated tubing and water chamber      10/09/18 1100           Follow-up: No follow-ups on file.  Mechele Claude, M.D.

## 2018-10-20 ENCOUNTER — Telehealth: Payer: Self-pay | Admitting: Family Medicine

## 2018-10-21 DIAGNOSIS — G4733 Obstructive sleep apnea (adult) (pediatric): Secondary | ICD-10-CM | POA: Diagnosis not present

## 2018-10-21 NOTE — Telephone Encounter (Signed)
Phone not accepting calls at this time. Order has been faxed, additional ppw to be signed has been sent to [place] and is on providers desk

## 2018-10-21 NOTE — Telephone Encounter (Signed)
Additional ppw faxed

## 2018-11-08 DIAGNOSIS — G4733 Obstructive sleep apnea (adult) (pediatric): Secondary | ICD-10-CM | POA: Diagnosis not present

## 2018-11-20 DIAGNOSIS — G4733 Obstructive sleep apnea (adult) (pediatric): Secondary | ICD-10-CM | POA: Diagnosis not present

## 2018-11-27 DIAGNOSIS — E119 Type 2 diabetes mellitus without complications: Secondary | ICD-10-CM | POA: Diagnosis not present

## 2018-11-27 DIAGNOSIS — Z961 Presence of intraocular lens: Secondary | ICD-10-CM | POA: Diagnosis not present

## 2018-11-27 DIAGNOSIS — H20021 Recurrent acute iridocyclitis, right eye: Secondary | ICD-10-CM | POA: Diagnosis not present

## 2018-12-01 ENCOUNTER — Ambulatory Visit (INDEPENDENT_AMBULATORY_CARE_PROVIDER_SITE_OTHER): Payer: PPO | Admitting: *Deleted

## 2018-12-01 ENCOUNTER — Encounter: Payer: Self-pay | Admitting: *Deleted

## 2018-12-01 ENCOUNTER — Other Ambulatory Visit: Payer: Self-pay

## 2018-12-01 DIAGNOSIS — Z0000 Encounter for general adult medical examination without abnormal findings: Secondary | ICD-10-CM

## 2018-12-01 NOTE — Progress Notes (Addendum)
MEDICARE ANNUAL WELLNESS VISIT  12/01/2018  Telephone Visit Disclaimer This Medicare AWV was conducted by telephone due to national recommendations for restrictions regarding the COVID-19 Pandemic (e.g. social distancing).  I verified, using two identifiers, that I am speaking with Brianna Nichols or their authorized healthcare agent. I discussed the limitations, risks, security, and privacy concerns of performing an evaluation and management service by telephone and the potential availability of an in-person appointment in the future. The patient expressed understanding and agreed to proceed.   Subjective:  Brianna Nichols is a 69 y.o. female patient of Stacks, Broadus John, MD who had a Medicare Annual Wellness Visit today via telephone. Brianna Nichols is a retired, and lives with her husband. She has 2 daughters, 6 grand children and 6 great grandchildren.  She reports that she is socially active and does interact with friends/family regularly.  . she is minimally physically active and she states she does ride a stationary bicycle once per week and uses light weight dumbbells 1-2 per week for exercise.  She enjoys doing yard work and crafts.  Patient Care Team: Mechele Claude, MD as PCP - General (Family Medicine) Baruch Gouty, MD (Vascular Surgery) Josephine Igo, DO as Consulting Physician (Pulmonary Disease)  Advanced Directives 12/01/2018  Does Patient Have a Medical Advance Directive? No  Would patient like information on creating a medical advance directive? Yes (MAU/Ambulatory/Procedural Areas - Information given)    Hospital Utilization Over the Past 12 Months: # of hospitalizations or ER visits: 1 ER visit for chest pain # of surgeries: 2- bilateral cataract removals.  Review of Systems    Patient reports that her overall health is worse compared to last year because she has increased aches and pains.   Review of Systems:   All systems negative as reported by patient.  Pain Assessment  Pain Score: 0-No pain     Current Medications & Allergies (verified) Allergies as of 12/01/2018      Reactions   Gabapentin Anaphylaxis   Morphine Other (See Comments)   Felt like body was folding over per pt Out-of-body experience   Nalbuphine Other (See Comments)   Felt like body was folding over per pt Out-of-body experience   Penicillins Anaphylaxis   anaphylaxis anaphylaxis   Diphenhydramine Hcl Other (See Comments)   Chest tightness   Diphenhydramine Hcl (sleep) Other (See Comments)   Other reaction(s): Other (See Comments) "chest tightness" "chest tightness"   Hydrocodone-acetaminophen    Other reaction(s): Other (See Comments) Ended up in ICU; Dilaudid okay.   Levofloxacin    Other reaction(s): Muscle Pain, Myalgias (intolerance) Muscular problems Muscular problems   Pollen Extract Other (See Comments)   Stuffy nose Stuffy nose   Metronidazole Itching   Facial swelling Facial swelling   Moxifloxacin Nausea Only, Other (See Comments)   Lightheaded per patient Lightheaded per patient   Sulfamethoxazole Rash   Tape Rash   rash rash      Medication List       Accurate as of Dec 01, 2018  4:07 PM. If you have any questions, ask your nurse or doctor.        albuterol (2.5 MG/3ML) 0.083% nebulizer solution Commonly known as:  PROVENTIL Inhale 3 mLs (2.5 mg total) into the lungs every 6 (six) hours as needed for wheezing or shortness of breath.   albuterol 108 (90 Base) MCG/ACT inhaler Commonly known as:  ProAir HFA INHALE 2 PUFFS INTO LUNGS EVERY FOUR HOURS AS NEEDED   ALPRAZolam  0.5 MG tablet Commonly known as:  XANAX Take 1 tablet (0.5 mg total) by mouth as needed for anxiety.   citalopram 20 MG tablet Commonly known as:  CELEXA Take 20 mg by mouth daily.   diclofenac sodium 1 % Gel Commonly known as:  VOLTAREN Apply 2 g topically 4 (four) times daily.   Fenofibric Acid 45 MG Cpdr TAKE 1 CAPSULE BY MOUTH EVERY DAY FOR HIGH CHOLESTEROL    fluocinonide cream 0.05 % Commonly known as:  LIDEX Apply to hand eczema twice a day as needed for rash   omega-3 acid ethyl esters 1 g capsule Commonly known as:  LOVAZA Take 2 capsules (2 g total) by mouth 2 (two) times daily.   omeprazole 40 MG capsule Commonly known as:  PRILOSEC Take 1 capsule (40 mg total) by mouth daily.   simvastatin 20 MG tablet Commonly known as:  ZOCOR TAKE 1 TABLET (20 MG TOTAL) BY MOUTH DAILY.   VITAMIN B-12 CR PO Chew 1 gummy daily   Vitamin D3 75 MCG (3000 UT) Tabs Take by mouth.       History (reviewed): Past Medical History:  Diagnosis Date  . Asthma   . Cataract   . Diverticulitis   . Eczema   . Hyperlipidemia   . Obstructive sleep apnea   . Pre-diabetes   . Psoriasis    Past Surgical History:  Procedure Laterality Date  . ABDOMINAL HYSTERECTOMY    . CHOLECYSTECTOMY    . COLON SURGERY    . EYE SURGERY    . HYSTEROTOMY    . LARYNX SURGERY     Family History  Problem Relation Age of Onset  . Cancer Mother        Renal Cancer  . Heart attack Father   . Coronary artery disease Father   . Cancer Sister        Pancreatic Cancer  . Heart attack Brother   . Coronary artery disease Sister   . Coronary artery disease Sister   . Coronary artery disease Sister   . Coronary artery disease Sister   . Diabetes Daughter    Social History   Socioeconomic History  . Marital status: Married    Spouse name: Not on file  . Number of children: 2  . Years of education: 9  . Highest education level: GED or equivalent  Occupational History  . Occupation: retired    Comment: Tax Production assistant, radio  . Financial resource strain: Not hard at all  . Food insecurity:    Worry: Never true    Inability: Never true  . Transportation needs:    Medical: No    Non-medical: No  Tobacco Use  . Smoking status: Former Smoker    Packs/day: 1.00    Years: 30.00    Pack years: 30.00    Types: Cigarettes    Last attempt to quit:  11/20/2016    Years since quitting: 2.0  . Smokeless tobacco: Never Used  Substance and Sexual Activity  . Alcohol use: No  . Drug use: No  . Sexual activity: Not Currently  Lifestyle  . Physical activity:    Days per week: 3 days    Minutes per session: 10 min  . Stress: Only a little  Relationships  . Social connections:    Talks on phone: More than three times a week    Gets together: More than three times a week    Attends religious service: More than 4 times  per year    Active member of club or organization: Yes    Attends meetings of clubs or organizations: More than 4 times per year    Relationship status: Married  Other Topics Concern  . Not on file  Social History Narrative  . Not on file    Activities of Daily Living In your present state of health, do you have any difficulty performing the following activities: 12/01/2018  Hearing? N  Vision? N  Difficulty concentrating or making decisions? Y  Comment Trouble remembering things at times  Walking or climbing stairs? N  Dressing or bathing? N  Doing errands, shopping? N  Preparing Food and eating ? N  Using the Toilet? N  In the past six months, have you accidently leaked urine? Y  Comment Urinary urgency  Do you have problems with loss of bowel control? N  Managing your Medications? N  Managing your Finances? N  Housekeeping or managing your Housekeeping? N  Some recent data might be hidden     Exercise Current Exercise Habits: Home exercise routine, Type of exercise: strength training/weights;Other - see comments(stationary bicycle ), Time (Minutes): 10, Frequency (Times/Week): 3, Weekly Exercise (Minutes/Week): 30, Intensity: Mild, Exercise limited by: respiratory conditions(s)  Diet Patient reports consuming 2 meals a day and 1 snack(s) a day Patient reports that her primary diet is: Regular Patient reports that she does have regular access to food.   Depression Screen PHQ 2/9 Scores 12/01/2018  07/08/2018  PHQ - 2 Score 1 0     Fall Risk Fall Risk  12/01/2018 07/08/2018 06/03/2018  Falls in the past year? 0 1 1  Number falls in past yr: - 1 1  Injury with Fall? - 1 1  Risk for fall due to : - - History of fall(s)     Objective:  Brianna Nichols seemed alert and oriented and she participated appropriately during our telephone visit.  Blood Pressure Weight BMI  BP Readings from Last 3 Encounters:  10/09/18 117/76  08/26/18 118/74  07/31/18 122/80   Wt Readings from Last 3 Encounters:  10/09/18 150 lb (68 kg)  08/26/18 144 lb (65.3 kg)  07/31/18 151 lb 6.4 oz (68.7 kg)   BMI Readings from Last 1 Encounters:  10/09/18 24.96 kg/m    *Unable to obtain current vital signs, weight, and BMI due to telephone visit type  Hearing/Vision  . Velecia did not seem to have difficulty with hearing/understanding during the telephone conversation . Reports that she has had a formal eye exam by an eye care professional within the past year- Had bilateral cataracts removed within the past year . Reports that she has not had a formal hearing evaluation within the past year *Unable to fully assess hearing and vision during telephone visit type  Cognitive Function: 6CIT Screen 12/01/2018  What Year? 0 points  What month? 0 points  What time? 0 points  Count back from 20 0 points  Months in reverse 0 points  Repeat phrase 0 points  Total Score 0    Normal Cognitive Function Screening: Yes (Normal:0-7, Significant for Dysfunction: >8)  Immunization & Health Maintenance Record Immunization History  Administered Date(s) Administered  . Td 03/26/2001  . Tdap 03/30/2010    Health Maintenance  Topic Date Due  . URINE MICROALBUMIN  07/05/1961  . MAMMOGRAM  07/05/2001  . DEXA SCAN  07/05/2016  . PNA vac Low Risk Adult (1 of 2 - PCV13) 06/04/2019 (Originally 07/05/2016)  . INFLUENZA VACCINE  02/21/2019  . TETANUS/TDAP  03/30/2020  . COLONOSCOPY  04/26/2021  . Hepatitis C Screening   Completed   Recommended Shingrix vaccine series and prevnar 13 at next in person visit.  Patient declined. Recommend urine microalbumin at next office visit Mammogram and Dexa scan scheduled.    Assessment  This is a routine wellness examination for [facility].  Health Maintenance: Due or Overdue Health Maintenance Due  Topic Date Due  . URINE MICROALBUMIN  07/05/1961  . MAMMOGRAM  07/05/2001  . DEXA SCAN  07/05/2016    Brianna Nichols does not need a referral for Community Assistance: Care Management:   no Social Work:    no Prescription Assistance:  no Nutrition/Diabetes Education:  no   Plan:  Personalized Goals Goals Addressed            This Visit's Progress   . DIET - INCREASE WATER INTAKE       Increase water intake to 48-64 oz per day.      Personalized Health Maintenance & Screening Recommendations  Pneumococcal vaccine  Bone densitometry screening Advanced directives: has NO advanced directive  - add't info requested. Referral to SW: no  Lung Cancer Screening Recommended: yes (Low Dose CT Chest recommended if Age 37-80 years, 30 pack-year currently smoking OR have quit w/in past 15 years) Hepatitis C Screening recommended: completed 03/02/2016   Advanced Directives: Written information was prepared per patient's request. Information mailed per patient's request. Referrals & Orders  Mammogram appointment scheduled 02/03/2019 and Dexa scan appointment 12/25/2018.   Follow-up Plan . Follow-up with Mechele Claude, MD as planned  . Keep mammogram appointment 02/03/2019 and Dexa scan appointment 12/25/2018.    I have personally reviewed and noted the following in the patient's chart:   . Medical and social history . Use of alcohol, tobacco or illicit drugs  . Current medications and supplements . Functional ability and status . Nutritional status . Physical activity . Advanced directives . List of other physicians . Hospitalizations, surgeries, and ER  visits in previous 12 months . Vitals . Screenings to include cognitive, depression, and falls . Referrals and appointments  In addition, I have reviewed and discussed with Brianna Nichols certain preventive protocols, quality metrics, and best practice recommendations. A written personalized care plan for preventive services as well as general preventive health recommendations is available and can be mailed to the patient at her request.      Lilia Argue, RN 12/01/2018  I have reviewed and agree with the above AWV documentation.   Mary-Margaret Daphine Deutscher, FNP

## 2018-12-01 NOTE — Patient Instructions (Signed)
  Brianna Nichols , Thank you for taking time talk with me for your Medicare Wellness Visit. I appreciate your ongoing commitment to your health goals. Please review the following plan we discussed and let me know if I can assist you in the future.   These are the goals we discussed: Goals    . DIET - INCREASE WATER INTAKE     Increase water intake to 48-64 oz per day.       This is a list of the screening recommended for you and due dates:  Health Maintenance  Topic Date Due  . Urine Protein Check  07/05/1961  . Mammogram  07/05/2001  . DEXA scan (bone density measurement)  07/05/2016  . Pneumonia vaccines (1 of 2 - PCV13) 06/04/2019*  . Flu Shot  02/21/2019  . Tetanus Vaccine  03/30/2020  . Colon Cancer Screening  04/26/2021  .  Hepatitis C: One time screening is recommended by Center for Disease Control  (CDC) for  adults born from [birth year] through 1965.   Completed  *Topic was postponed. The date shown is not the original due date.

## 2018-12-02 ENCOUNTER — Encounter: Payer: Self-pay | Admitting: Family Medicine

## 2018-12-02 ENCOUNTER — Other Ambulatory Visit: Payer: Self-pay

## 2018-12-02 ENCOUNTER — Ambulatory Visit (INDEPENDENT_AMBULATORY_CARE_PROVIDER_SITE_OTHER): Payer: PPO | Admitting: Family Medicine

## 2018-12-02 DIAGNOSIS — R12 Heartburn: Secondary | ICD-10-CM | POA: Diagnosis not present

## 2018-12-02 DIAGNOSIS — F419 Anxiety disorder, unspecified: Secondary | ICD-10-CM

## 2018-12-02 DIAGNOSIS — J432 Centrilobular emphysema: Secondary | ICD-10-CM | POA: Diagnosis not present

## 2018-12-02 DIAGNOSIS — G4733 Obstructive sleep apnea (adult) (pediatric): Secondary | ICD-10-CM

## 2018-12-02 MED ORDER — CITALOPRAM HYDROBROMIDE 20 MG PO TABS: 20.0000 mg | ORAL_TABLET | Freq: Every day | ORAL | 1 refills | Status: DC

## 2018-12-02 MED ORDER — ALPRAZOLAM 0.5 MG PO TABS: 0.5000 mg | ORAL_TABLET | ORAL | 5 refills | Status: DC | PRN

## 2018-12-02 MED ORDER — SIMVASTATIN 20 MG PO TABS: ORAL_TABLET | ORAL | 1 refills | Status: DC

## 2018-12-02 MED ORDER — ALBUTEROL SULFATE (2.5 MG/3ML) 0.083% IN NEBU: 2.5000 mg | INHALATION_SOLUTION | Freq: Four times a day (QID) | RESPIRATORY_TRACT | 11 refills | Status: DC | PRN

## 2018-12-02 MED ORDER — OMEGA-3-ACID ETHYL ESTERS 1 G PO CAPS: 2.0000 | ORAL_CAPSULE | Freq: Two times a day (BID) | ORAL | 1 refills | Status: DC

## 2018-12-02 MED ORDER — OMEPRAZOLE 40 MG PO CPDR: 40.0000 mg | DELAYED_RELEASE_CAPSULE | Freq: Every day | ORAL | 1 refills | Status: DC

## 2018-12-02 MED ORDER — UMECLIDINIUM-VILANTEROL 62.5-25 MCG/INH IN AEPB: INHALATION_SPRAY | RESPIRATORY_TRACT | 11 refills | Status: DC

## 2018-12-02 MED ORDER — FENOFIBRIC ACID 45 MG PO CPDR: DELAYED_RELEASE_CAPSULE | ORAL | 1 refills | Status: DC

## 2018-12-02 NOTE — Progress Notes (Signed)
Subjective:    Patient ID: Brianna Nichols, female    DOB: [birth year], 68 y.o.   MRN: [id number]   HPI: Brianna Nichols is a 68 y.o. female presenting for needing nebulizer meds. Skipping celexa and xanax at times. Gets irritable, a lot of anxiety. See GAd below.   Also feels like she stops breathing at times. Was given anoro inhaler in past. Not using regularly, but is getting some relief with frequnent use of albuterol neb.   Patient in for follow-up of elevated cholesterol. Doing well without complaints on current medication. Denies side effects of statin including myalgia and arthralgia and nausea. Currently no chest pain, shortness of breath or other cardiovascular related symptoms noted.  Getting some reflux symptoms.    [facility] 12/01/2018 07/08/2018  Decreased Interest 0 0  Down, Depressed, Hopeless 1 0  PHQ - 2 Score 1 0   GAD 7 : Generalized Anxiety Score 12/02/2018 06/03/2018  Nervous, Anxious, on Edge 2 1  Control/stop worrying 2 1  Worry too much - different things 1 1  Trouble relaxing 2 1  Restless 3 1  Easily annoyed or irritable 2 1  Afraid - awful might happen 1 0  Total GAD 7 Score 13 6  Anxiety Difficulty - Somewhat difficult       Relevant past medical, surgical, family and social history reviewed and updated as indicated.  Interim medical history since our last visit reviewed. Allergies and medications reviewed and updated.  ROS:  Review of Systems  Constitutional: Negative.   HENT: Negative for congestion.   Eyes: Negative for visual disturbance.  Respiratory: Positive for apnea (happening a lot lately.). Negative for shortness of breath.   Cardiovascular: Negative for chest pain.  Gastrointestinal: Negative for abdominal pain, constipation, diarrhea, nausea and vomiting.  Genitourinary: Negative for difficulty urinating.  Musculoskeletal: Negative for arthralgias and myalgias.  Neurological: Negative for headaches.   Psychiatric/Behavioral: Negative for sleep disturbance.     Social History   Tobacco Use  Smoking Status Former Smoker  . Packs/day: 1.00  . Years: 30.00  . Pack years: 30.00  . Types: Cigarettes  . Last attempt to quit: 11/20/2016  . Years since quitting: 2.0  Smokeless Tobacco Never Used       Objective:     Wt Readings from Last 3 Encounters:  10/09/18 150 lb (68 kg)  08/26/18 144 lb (65.3 kg)  07/31/18 151 lb 6.4 oz (68.7 kg)     Exam deferred. Pt. Harboring due to COVID 19. Phone visit performed.   Assessment & Plan:   1. Centrilobular emphysema (HCC)   2. OBSTRUCTIVE SLEEP APNEA   3. Heartburn   4. Anxiety     Meds ordered this encounter  Medications  . albuterol (PROVENTIL) (2.5 MG/3ML) 0.083% nebulizer solution    Sig: Inhale 3 mLs (2.5 mg total) into the lungs every 6 (six) hours as needed for wheezing or shortness of breath.    Dispense:  125 mL    Refill:  11  . omega-3 acid ethyl esters (LOVAZA) 1 g capsule    Sig: Take 2 capsules (2 g total) by mouth 2 (two) times daily.    Dispense:  360 capsule    Refill:  1  . simvastatin (ZOCOR) 20 MG tablet    Sig: TAKE 1 TABLET (20 MG TOTAL) BY MOUTH DAILY.    Dispense:  90 tablet    Refill:  1  . omeprazole (PRILOSEC) 40 MG capsule  Sig: Take 1 capsule (40 mg total) by mouth daily.    Dispense:  90 capsule    Refill:  1  . Choline Fenofibrate (FENOFIBRIC ACID) 45 MG CPDR    Sig: TAKE 1 CAPSULE BY MOUTH EVERY DAY FOR HIGH CHOLESTEROL    Dispense:  90 capsule    Refill:  1  . citalopram (CELEXA) 20 MG tablet    Sig: Take 1 tablet (20 mg total) by mouth daily.    Dispense:  90 tablet    Refill:  1  . umeclidinium-vilanterol (ANORO ELLIPTA) 62.5-25 MCG/INH AEPB    Sig: One puff daily    Dispense:  1 each    Refill:  11  . ALPRAZolam (XANAX) 0.5 MG tablet    Sig: Take 1 tablet (0.5 mg total) by mouth as needed for anxiety.    Dispense:  30 tablet    Refill:  5    No orders of the defined types  were placed in this encounter.     Diagnoses and all orders for this visit:  Centrilobular emphysema (HCC)  OBSTRUCTIVE SLEEP APNEA  Heartburn  Anxiety  Other orders -     albuterol (PROVENTIL) (2.5 MG/3ML) 0.083% nebulizer solution; Inhale 3 mLs (2.5 mg total) into the lungs every 6 (six) hours as needed for wheezing or shortness of breath. -     omega-3 acid ethyl esters (LOVAZA) 1 g capsule; Take 2 capsules (2 g total) by mouth 2 (two) times daily. -     simvastatin (ZOCOR) 20 MG tablet; TAKE 1 TABLET (20 MG TOTAL) BY MOUTH DAILY. -     omeprazole (PRILOSEC) 40 MG capsule; Take 1 capsule (40 mg total) by mouth daily. -     Choline Fenofibrate (FENOFIBRIC ACID) 45 MG CPDR; TAKE 1 CAPSULE BY MOUTH EVERY DAY FOR HIGH CHOLESTEROL -     citalopram (CELEXA) 20 MG tablet; Take 1 tablet (20 mg total) by mouth daily. -     umeclidinium-vilanterol (ANORO ELLIPTA) 62.5-25 MCG/INH AEPB; One puff daily -     ALPRAZolam (XANAX) 0.5 MG tablet; Take 1 tablet (0.5 mg total) by mouth as needed for anxiety.    Virtual Visit via telephone Note  I discussed the limitations, risks, security and privacy concerns of performing an evaluation and management service by telephone and the availability of in person appointments. The patient was identified with two identifiers. Pt.expressed understanding and agreed to proceed. Pt. Is at home. Dr. Darlyn Read is in his office.  Follow Up Instructions:   I discussed the assessment and treatment plan with the patient. The patient was provided an opportunity to ask questions and all were answered. The patient agreed with the plan and demonstrated an understanding of the instructions.   The patient was advised to call back or seek an in-person evaluation if the symptoms worsen or if the condition fails to improve as anticipated.   Total minutes including chart review and phone contact time: 35   Follow up plan: Return in about 3 months (around 03/04/2019).   Mechele Claude, MD [place]

## 2018-12-21 DIAGNOSIS — G4733 Obstructive sleep apnea (adult) (pediatric): Secondary | ICD-10-CM | POA: Diagnosis not present

## 2018-12-25 ENCOUNTER — Other Ambulatory Visit: Payer: Self-pay

## 2018-12-30 ENCOUNTER — Other Ambulatory Visit: Payer: Self-pay

## 2018-12-30 ENCOUNTER — Encounter (HOSPITAL_COMMUNITY): Payer: Self-pay

## 2018-12-30 ENCOUNTER — Emergency Department (HOSPITAL_COMMUNITY)
Admission: EM | Admit: 2018-12-30 | Discharge: 2018-12-30 | Disposition: A | Discharge status: Home / Self Care | Payer: PPO | Attending: Emergency Medicine | Admitting: Emergency Medicine

## 2018-12-30 ENCOUNTER — Emergency Department (HOSPITAL_COMMUNITY): Payer: PPO

## 2018-12-30 DIAGNOSIS — R0789 Other chest pain: Secondary | ICD-10-CM | POA: Diagnosis not present

## 2018-12-30 DIAGNOSIS — X509XXA Other and unspecified overexertion or strenuous movements or postures, initial encounter: Secondary | ICD-10-CM | POA: Insufficient documentation

## 2018-12-30 DIAGNOSIS — Z79899 Other long term (current) drug therapy: Secondary | ICD-10-CM | POA: Insufficient documentation

## 2018-12-30 DIAGNOSIS — Y92007 Garden or yard of unspecified non-institutional (private) residence as the place of occurrence of the external cause: Secondary | ICD-10-CM | POA: Diagnosis not present

## 2018-12-30 DIAGNOSIS — Z8673 Personal history of transient ischemic attack (TIA), and cerebral infarction without residual deficits: Secondary | ICD-10-CM | POA: Diagnosis not present

## 2018-12-30 DIAGNOSIS — Z87891 Personal history of nicotine dependence: Secondary | ICD-10-CM | POA: Diagnosis not present

## 2018-12-30 DIAGNOSIS — Y999 Unspecified external cause status: Secondary | ICD-10-CM | POA: Diagnosis not present

## 2018-12-30 DIAGNOSIS — Y93H2 Activity, gardening and landscaping: Secondary | ICD-10-CM | POA: Insufficient documentation

## 2018-12-30 DIAGNOSIS — S2232XA Fracture of one rib, left side, initial encounter for closed fracture: Secondary | ICD-10-CM | POA: Insufficient documentation

## 2018-12-30 DIAGNOSIS — R05 Cough: Secondary | ICD-10-CM | POA: Diagnosis not present

## 2018-12-30 DIAGNOSIS — I2510 Atherosclerotic heart disease of native coronary artery without angina pectoris: Secondary | ICD-10-CM | POA: Insufficient documentation

## 2018-12-30 DIAGNOSIS — R079 Chest pain, unspecified: Secondary | ICD-10-CM | POA: Diagnosis not present

## 2018-12-30 LAB — BASIC METABOLIC PANEL
Anion gap: 9 (ref 5–15)
BUN: 11 mg/dL (ref 8–23)
CO2: 25 mmol/L (ref 22–32)
Calcium: 9.4 mg/dL (ref 8.9–10.3)
Chloride: 106 mmol/L (ref 98–111)
Creatinine, Ser: 0.63 mg/dL (ref 0.44–1.00)
GFR calc Af Amer: >60 mL/min (ref >60)
GFR calc non Af Amer: >60 mL/min (ref >60)
Glucose, Bld: 123 mg/dL — ABNORMAL HIGH (ref 70–99)
Potassium: 4.6 mmol/L (ref 3.5–5.1)
Sodium: 140 mmol/L (ref 135–145)

## 2018-12-30 LAB — CBC
HCT: 44 % (ref 36.0–46.0)
Hemoglobin: 14.5 g/dL (ref 12.0–15.0)
MCH: 32 pg (ref 26.0–34.0)
MCHC: 33 g/dL (ref 30.0–36.0)
MCV: 97.1 fL (ref 80.0–100.0)
Platelets: 316 10*3/uL (ref 150–400)
RBC: 4.53 MIL/uL (ref 3.87–5.11)
RDW: 13.9 % (ref 11.5–15.5)
WBC: 5.8 10*3/uL (ref 4.0–10.5)
nRBC: 0 % (ref 0.0–0.2)

## 2018-12-30 LAB — TROPONIN I: Troponin I: <0.03 ng/mL (ref <0.03)

## 2018-12-30 MED ORDER — IOHEXOL 350 MG/ML SOLN
100.0000 mL | Freq: Once | INTRAVENOUS | Status: AC | PRN
  Administered 2018-12-30: 100 mL via INTRAVENOUS

## 2018-12-30 MED ORDER — HYDROMORPHONE HCL 1 MG/ML IJ SOLN: 1.0000 mg | Freq: Once | INTRAMUSCULAR | Status: DC

## 2018-12-30 MED ORDER — SODIUM CHLORIDE 0.9% FLUSH
3.0000 mL | Freq: Once | INTRAVENOUS | Status: AC
  Administered 2018-12-30: 3 mL via INTRAVENOUS

## 2018-12-30 MED ORDER — HYDROMORPHONE HCL 1 MG/ML IJ SOLN
1.0000 mg | INTRAMUSCULAR | Status: DC | PRN
  Administered 2018-12-30: 12:00:00 1 mg via INTRAVENOUS
  Filled 2018-12-30: qty 1

## 2018-12-30 MED ORDER — HYDROMORPHONE HCL 2 MG PO TABS: 1.0000 mg | ORAL_TABLET | Freq: Two times a day (BID) | ORAL | 0 refills | Status: DC | PRN

## 2018-12-30 MED ORDER — ACETAMINOPHEN 325 MG PO TABS
650.0000 mg | ORAL_TABLET | Freq: Once | ORAL | Status: AC
  Administered 2018-12-30: 650 mg via ORAL
  Filled 2018-12-30: qty 2

## 2018-12-30 NOTE — ED Notes (Signed)
Patient transported to X-ray 

## 2018-12-30 NOTE — Discharge Instructions (Signed)
Take the prescription as directed.  Apply moist heat or ice to the area(s) of discomfort, for 15 minutes at a time, several times per day for the next few days.  Do not fall asleep on a heating or ice pack. Use your incentive spirometer: hold a small/firm pillow against your chest wall (to help splint the area) and perform 3 deep inhalations every 1 hour while you are awake for the next 2 to 3 weeks. Call your regular medical doctor today to schedule a follow up appointment within the next 2 days.  Return to the Emergency Department immediately sooner if worsening.

## 2018-12-30 NOTE — ED Triage Notes (Addendum)
Pt report epigatric pain for 2 days, now pain radiates under left breast. Reports makes her cough. Pain is constant and severe. Pt reports she had been shoveling sand

## 2018-12-30 NOTE — ED Notes (Signed)
Pt pulled tick off right lateral side this am.
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
## 2018-12-30 NOTE — ED Notes (Addendum)
Been working in the yard for the last 3 days, shoveling sand around swimming, mowing grass and weed eating.  C/o pain to mid chest and under left breast.  Rates Pain 9/10, sharp Pain. Pt reports increase pain with (non-productive)cough.  C/o SOB since early yesterday with rest.

## 2019-01-06 ENCOUNTER — Ambulatory Visit (INDEPENDENT_AMBULATORY_CARE_PROVIDER_SITE_OTHER): Payer: PPO

## 2019-01-06 ENCOUNTER — Encounter: Payer: Self-pay | Admitting: Family Medicine

## 2019-01-06 ENCOUNTER — Other Ambulatory Visit: Payer: Self-pay

## 2019-01-06 ENCOUNTER — Ambulatory Visit (INDEPENDENT_AMBULATORY_CARE_PROVIDER_SITE_OTHER): Payer: PPO | Admitting: Family Medicine

## 2019-01-06 VITALS — BP 154/82 | HR 75 | Temp 97.3°F | Ht 65.0 in | Wt 147.0 lb

## 2019-01-06 DIAGNOSIS — R05 Cough: Secondary | ICD-10-CM

## 2019-01-06 DIAGNOSIS — R7303 Prediabetes: Secondary | ICD-10-CM | POA: Diagnosis not present

## 2019-01-06 DIAGNOSIS — S2232XA Fracture of one rib, left side, initial encounter for closed fracture: Secondary | ICD-10-CM

## 2019-01-06 DIAGNOSIS — R059 Cough, unspecified: Secondary | ICD-10-CM

## 2019-01-06 MED ORDER — BLOOD GLUCOSE METER KIT: PACK | 0 refills | Status: DC

## 2019-01-06 NOTE — Progress Notes (Signed)
Chief Complaint  Patient presents with  . ER follow up    Rib fractures    HPI  Patient presents today for pain still at the left lower rib cage.  She is developed a cough.  Rib fracture was noted on CT scan in the ER 1 week ago due to pain.  She still has some painful respiration and difficulty getting a nice deep breath due to pain.  She is concerned since she is getting the cough with some clear sputum that she might be developing pneumonia.  Patient changed insurances and needs a new glucose monitor due to diagnosis prediabetes. PMH: Smoking status noted ROS: Per HPI  Objective: BP (!) 154/82   Pulse 75   Temp (!) 97.3 F (36.3 C) (Oral)   Ht '5\' 5"'  (1.651 m)   Wt 147 lb (66.7 kg)   BMI 24.46 kg/m  Gen: NAD, alert, cooperative with exam HEENT: NCAT, EOMI, PERRL CV: RRR, good S1/S2, no murmur Resp: CTABL, no wheezes, non-labored Abd: SNTND, BS present, no guarding or organomegaly Ext: No edema, warm Neuro: Alert and oriented, No gross deficits Chest x-ray: No acute disease noted.  Rib fracture Assessment and plan:  1. Cough   2. Closed fracture of one rib of left side, initial encounter   3. Prediabetes     Meds ordered this encounter  Medications  . blood glucose meter kit and supplies    Sig: Dispense based on patient and insurance preference. Use up to four times daily as directed. (FOR ICD-10 E10.9, E11.9).    Dispense:  1 each    Refill:  0    Order Specific Question:   Number of strips    Answer:   100    Order Specific Question:   Number of lancets    Answer:   100    Orders Placed This Encounter  Procedures  . DG Chest 2 View    Standing Status:   Future    Number of Occurrences:   1    Standing Expiration Date:   03/07/2020    Order Specific Question:   Reason for Exam (SYMPTOM  OR DIAGNOSIS REQUIRED)    Answer:   cough    Order Specific Question:   Preferred imaging location?    Answer:   Internal    Follow up as needed.  Claretta Fraise,  MD

## 2019-01-07 ENCOUNTER — Encounter: Payer: Self-pay | Admitting: Family Medicine

## 2019-01-09 ENCOUNTER — Other Ambulatory Visit: Payer: Self-pay | Admitting: Family Medicine

## 2019-01-09 MED ORDER — TRAMADOL HCL 50 MG PO TABS: 100.0000 mg | ORAL_TABLET | Freq: Four times a day (QID) | ORAL | 0 refills | Status: DC | PRN

## 2019-01-20 ENCOUNTER — Other Ambulatory Visit: Payer: Self-pay

## 2019-01-20 ENCOUNTER — Encounter: Payer: Self-pay | Admitting: Family Medicine

## 2019-01-20 ENCOUNTER — Ambulatory Visit (INDEPENDENT_AMBULATORY_CARE_PROVIDER_SITE_OTHER): Payer: PPO | Admitting: Family Medicine

## 2019-01-20 DIAGNOSIS — H10531 Contact blepharoconjunctivitis, right eye: Secondary | ICD-10-CM | POA: Diagnosis not present

## 2019-01-20 MED ORDER — POLYMYXIN B-TRIMETHOPRIM 10000-0.1 UNIT/ML-% OP SOLN: 1.0000 [drp] | OPHTHALMIC | 0 refills | Status: AC

## 2019-01-20 NOTE — Progress Notes (Signed)
Virtual Visit via telephone Note Due to COVID-19, visit is conducted virtually and was requested by patient. This visit type was conducted due to national recommendations for restrictions regarding the COVID-19 Pandemic (e.g. social distancing) in an effort to limit this patient's exposure and mitigate transmission in our community. All issues noted in this document were discussed and addressed.  A physical exam was not performed with this format.   I connected with Brianna Nichols on 01/20/19 at 1415 by telephone and verified that I am speaking with the correct person using two identifiers. Brianna Nichols is currently located at home and family is currently with them during visit. The provider, Monia Pouch, FNP is located in their office at time of visit.  I discussed the limitations, risks, security and privacy concerns of performing an evaluation and management service by telephone and the availability of in person appointments. I also discussed with the patient that there may be a patient responsible charge related to this service. The patient expressed understanding and agreed to proceed.  Subjective:  Patient ID: Brianna Nichols, female    DOB: April 08, 1951, 68 y.o.   MRN: [id number]  Chief Complaint:  Eye Pain (swelling, discharge)   HPI: Brianna Nichols is a 68 y.o. female presenting on 01/20/2019 for Eye Pain (swelling, discharge)   Pt reports she had a red swollen lesion to her right lower lateral eyelid eight days ago. States she massaged this and used warm compresses to get the drainage out. States it went aware and then returned 3 days ago. States now her eye is burning and her lid is very tender to touch. States the redness and swelling to her lower lid has increased and she now has yellow drainage from the eye. States it is pruritic in the mornings when she wakes due to crusting. Cataract surgery in January. Denies complications from surgery. No injury or known foreign body. No foreign body  sensation. No visual disturbances. No headache, fever, chills, confusion, or weakness.   Eye Pain  The right eye is affected. This is a new problem. The current episode started 1 to 4 weeks ago. The problem occurs constantly. The problem has been gradually worsening. There was no injury mechanism. The pain is at a severity of 4/10. The pain is mild. There is no known exposure to pink eye. She does not wear contacts. Associated symptoms include an eye discharge, eye redness and itching. Pertinent negatives include no blurred vision, double vision, fever, foreign body sensation, nausea, photophobia, recent URI, vomiting or weakness. She has tried water (warm compresses, massage) for the symptoms. The treatment provided mild relief.     Relevant past medical, surgical, family, and social history reviewed and updated as indicated.  Allergies and medications reviewed and updated.   Past Medical History:  Diagnosis Date  . Asthma   . Cataract   . Diverticulitis   . Eczema   . Hyperlipidemia   . Obstructive sleep apnea   . Pre-diabetes   . Psoriasis     Past Surgical History:  Procedure Laterality Date  . ABDOMINAL HYSTERECTOMY    . CHOLECYSTECTOMY    . COLON SURGERY    . EYE SURGERY    . HYSTEROTOMY    . LARYNX SURGERY      Social History   Socioeconomic History  . Marital status: Married    Spouse name: Not on file  . Number of children: 2  . Years of education: 9  . Highest education  level: GED or equivalent  Occupational History  . Occupation: retired    Comment: Tax Printmaker  . Financial resource strain: Not hard at all  . Food insecurity    Worry: Never true    Inability: Never true  . Transportation needs    Medical: No    Non-medical: No  Tobacco Use  . Smoking status: Former Smoker    Packs/day: 1.00    Years: 30.00    Pack years: 30.00    Types: Cigarettes    Quit date: 11/20/2016    Years since quitting: 2.1  . Smokeless tobacco: Never Used   Substance and Sexual Activity  . Alcohol use: No  . Drug use: No  . Sexual activity: Not Currently  Lifestyle  . Physical activity    Days per week: 3 days    Minutes per session: 10 min  . Stress: Only a little  Relationships  . Social connections    Talks on phone: More than three times a week    Gets together: More than three times a week    Attends religious service: More than 4 times per year    Active member of club or organization: Yes    Attends meetings of clubs or organizations: More than 4 times per year    Relationship status: Married  . Intimate partner violence    Fear of current or ex partner: No    Emotionally abused: No    Physically abused: No    Forced sexual activity: No  Other Topics Concern  . Not on file  Social History Narrative  . Not on file    Outpatient Encounter Medications as of 01/20/2019  Medication Sig  . albuterol (PROAIR HFA) 108 (90 Base) MCG/ACT inhaler INHALE 2 PUFFS INTO LUNGS EVERY FOUR HOURS AS NEEDED  . albuterol (PROVENTIL) (2.5 MG/3ML) 0.083% nebulizer solution Inhale 3 mLs (2.5 mg total) into the lungs every 6 (six) hours as needed for wheezing or shortness of breath.  . ALPRAZolam (XANAX) 0.5 MG tablet Take 1 tablet (0.5 mg total) by mouth as needed for anxiety.  . blood glucose meter kit and supplies Dispense based on patient and insurance preference. Use up to four times daily as directed. (FOR ICD-10 E10.9, E11.9).  Marland Kitchen Cholecalciferol (VITAMIN D3) 75 MCG (3000 UT) TABS Take by mouth.  . Choline Fenofibrate (FENOFIBRIC ACID) 45 MG CPDR TAKE 1 CAPSULE BY MOUTH EVERY DAY FOR HIGH CHOLESTEROL  . citalopram (CELEXA) 20 MG tablet Take 1 tablet (20 mg total) by mouth daily.  . Cyanocobalamin (VITAMIN B-12 CR PO) Chew 1 gummy daily  . diclofenac sodium (VOLTAREN) 1 % GEL Apply 2 g topically 4 (four) times daily.  . fluocinonide cream (LIDEX) 0.05 % Apply to hand eczema twice a day as needed for rash  . HYDROmorphone (DILAUDID) 2 MG  tablet Take 0.5 tablets (1 mg total) by mouth every 12 (twelve) hours as needed for moderate pain or severe pain. (Patient not taking: Reported on 01/06/2019)  . omega-3 acid ethyl esters (LOVAZA) 1 g capsule Take 2 capsules (2 g total) by mouth 2 (two) times daily.  Marland Kitchen omeprazole (PRILOSEC) 40 MG capsule Take 1 capsule (40 mg total) by mouth daily.  . simvastatin (ZOCOR) 20 MG tablet TAKE 1 TABLET (20 MG TOTAL) BY MOUTH DAILY.  . traMADol (ULTRAM) 50 MG tablet Take 2 tablets (100 mg total) by mouth 4 (four) times daily as needed for moderate pain.  Marland Kitchen trimethoprim-polymyxin b (POLYTRIM)  ophthalmic solution Place 1 drop into the right eye every 4 (four) hours for 7 days.  Marland Kitchen umeclidinium-vilanterol (ANORO ELLIPTA) 62.5-25 MCG/INH AEPB One puff daily   No facility-administered encounter medications on file as of 01/20/2019.     Allergies  Allergen Reactions  . Gabapentin Anaphylaxis  . Morphine Other (See Comments)    Felt like body was folding over per pt Out-of-body experience   . Nalbuphine Other (See Comments)    Felt like body was folding over per pt Out-of-body experience   . Penicillins Anaphylaxis    anaphylaxis anaphylaxis   . Diphenhydramine Hcl Other (See Comments)    Chest tightness  . Diphenhydramine Hcl (Sleep) Other (See Comments)    Other reaction(s): Other (See Comments) "chest tightness" "chest tightness"   . Hydrocodone-Acetaminophen     Other reaction(s): Other (See Comments) Ended up in ICU; Dilaudid okay.  . Levofloxacin     Other reaction(s): Muscle Pain, Myalgias (intolerance) Muscular problems Muscular problems   . Pollen Extract Other (See Comments)    Stuffy nose Stuffy nose   . Metronidazole Itching    Facial swelling Facial swelling   . Moxifloxacin Nausea Only and Other (See Comments)    Lightheaded per patient Lightheaded per patient   . Sulfamethoxazole Rash  . Tape Rash    rash rash     Review of Systems  Constitutional: Negative  for chills, fatigue and fever.  HENT: Negative.   Eyes: Positive for pain, discharge, redness and itching. Negative for blurred vision, double vision, photophobia and visual disturbance.  Respiratory: Negative for shortness of breath.   Cardiovascular: Negative for chest pain and palpitations.  Gastrointestinal: Negative for nausea and vomiting.  Neurological: Negative for dizziness, weakness, light-headedness and headaches.  Psychiatric/Behavioral: Negative for confusion.  All other systems reviewed and are negative.        Observations/Objective: No vital signs or physical exam, this was a telephone or virtual health encounter.  Pt alert and oriented, answers all questions appropriately, and able to speak in full sentences.    Assessment and Plan: Brianna Nichols was seen today for eye pain.  Diagnoses and all orders for this visit:  Contact blepharoconjunctivitis of right eye Reported symptoms consistent with blepharoconjunctivitis. Symptomatic care discussed. Will treat with Polytrim drops. Pt aware of symptoms that require emergent evaluation. Report any new or worsening symptoms. Medications as prescribed. -     trimethoprim-polymyxin b (POLYTRIM) ophthalmic solution; Place 1 drop into the right eye every 4 (four) hours for 7 days.     Follow Up Instructions: Return if symptoms worsen or fail to improve.    I discussed the assessment and treatment plan with the patient. The patient was provided an opportunity to ask questions and all were answered. The patient agreed with the plan and demonstrated an understanding of the instructions.   The patient was advised to call back or seek an in-person evaluation if the symptoms worsen or if the condition fails to improve as anticipated.  The above assessment and management plan was discussed with the patient. The patient verbalized understanding of and has agreed to the management plan. Patient is aware to call the clinic if symptoms  persist or worsen. Patient is aware when to return to the clinic for a follow-up visit. Patient educated on when it is appropriate to go to the emergency department.    I provided 15 minutes of non-face-to-face time during this encounter. The call started at 1415. The call ended at 1430. The other  time was used for coordination of care.    Monia Pouch, FNP-C [facility] [address] [place], [place] [address] [phone]

## 2019-01-26 DIAGNOSIS — H0013 Chalazion right eye, unspecified eyelid: Secondary | ICD-10-CM | POA: Diagnosis not present

## 2019-02-03 ENCOUNTER — Other Ambulatory Visit: Payer: Self-pay

## 2019-02-03 ENCOUNTER — Telehealth: Payer: Self-pay | Admitting: Family Medicine

## 2019-02-03 DIAGNOSIS — Z1231 Encounter for screening mammogram for malignant neoplasm of breast: Secondary | ICD-10-CM | POA: Diagnosis not present

## 2019-02-03 NOTE — Telephone Encounter (Signed)
We should have someone here soon. It is okay to wait. Can order elsewhere if pt prefers not to wait. WS

## 2019-02-06 NOTE — Telephone Encounter (Signed)
Multiple attempts made to contact patient.  This encounter will now be closed  

## 2019-03-09 DIAGNOSIS — E119 Type 2 diabetes mellitus without complications: Secondary | ICD-10-CM | POA: Diagnosis not present

## 2019-03-09 DIAGNOSIS — Z961 Presence of intraocular lens: Secondary | ICD-10-CM | POA: Diagnosis not present

## 2019-05-23 ENCOUNTER — Other Ambulatory Visit: Payer: Self-pay | Admitting: Family Medicine

## 2019-06-22 ENCOUNTER — Other Ambulatory Visit: Payer: Self-pay | Admitting: *Deleted

## 2019-06-27 ENCOUNTER — Other Ambulatory Visit: Payer: Self-pay | Admitting: Family Medicine

## 2019-07-01 ENCOUNTER — Other Ambulatory Visit: Payer: Self-pay | Admitting: Family Medicine

## 2019-07-03 MED ORDER — ALPRAZOLAM 0.5 MG PO TABS: 0.5000 mg | ORAL_TABLET | ORAL | 5 refills | Status: DC | PRN

## 2019-07-03 NOTE — Telephone Encounter (Signed)
VM from pt wanting to know if her alprazolam could be refilled. Under a lot of stress with husband quarantined with Covid Please advise

## 2019-09-09 ENCOUNTER — Telehealth: Payer: Self-pay | Admitting: Family Medicine

## 2019-09-09 NOTE — Chronic Care Management (AMB) (Signed)
  Chronic Care Management   Outreach Note  09/09/2019 Name: Brianna Nichols MRN: [id number] DOB: [birth year]  [name] is a 69 y.o. year old female who is a primary care patient of Stacks, Broadus John, MD. I reached out to [employer] by phone today in response to a referral sent by Ms. [name]'s health plan.     An unsuccessful telephone outreach was attempted today. The patient was referred to the case management team for assistance with care management and care coordination.   Follow Up Plan: The care management team will reach out to the patient again over the next 7 days.  If patient returns call to provider office, please advise to call Embedded Care Management Care Guide Penne Lash at [phone]  Penne Lash, RMA Care Guide, Embedded Care Coordination [facility]  [place], [place] [address] Direct Dial: [phone] [email or web address] Website: [email or web address]

## 2019-09-14 NOTE — Chronic Care Management (AMB) (Signed)
  Chronic Care Management   Outreach Note  09/14/2019 Name: ALEIGHA GILANI MRN: [id number] DOB: 07/03/1951  [name] is a 69 y.o. year old female who is a primary care patient of Stacks, Broadus John, MD. I reached out to [facility] [facility] by phone today in response to a referral sent by Ms. Raneen A Sheek's health plan.     A second unsuccessful telephone outreach was attempted today. The patient was referred to the case management team for assistance with care management and care coordination.   Follow Up Plan: The care management team will reach out to the patient again over the next 7 days.  If patient returns call to provider office, please advise to call Embedded Care Management Care Guide Penne Lash at [phone]  Penne Lash, RMA Care Guide, Embedded Care Coordination [facility]  [place], [place] [address] Direct Dial: [phone] [email or web address]: [email or web address]

## 2019-09-15 NOTE — Chronic Care Management (AMB) (Signed)
  Chronic Care Management   Outreach Note  09/15/2019 Name: Brianna Nichols MRN: [id number] DOB: 1951/07/05  Brianna Nichols is a 69 y.o. year old female who is a primary care patient of Stacks, Broadus John, MD. I reached out to [facility] [facility] by phone today in response to a referral sent by Brianna Nichols's health plan.     Third unsuccessful telephone outreach was attempted today. The patient was referred to the case management team for assistance with care management and care coordination. The patient's primary care provider has been notified of our unsuccessful attempts to make or maintain contact with the patient. The care management team is pleased to engage with this patient at any time in the future should he/she be interested in assistance from the care management team.   Follow Up Plan: The care management team is available to follow up with the patient after provider conversation with the patient regarding recommendation for care management engagement and subsequent re-referral to the care management team.   Penne Lash, RMA Care Guide, Embedded Care Coordination [facility]  [place], [place] [address] Direct Dial: [phone] [email or web address]: [email or web address]

## 2019-09-21 DIAGNOSIS — Z961 Presence of intraocular lens: Secondary | ICD-10-CM | POA: Diagnosis not present

## 2019-09-21 DIAGNOSIS — E119 Type 2 diabetes mellitus without complications: Secondary | ICD-10-CM | POA: Diagnosis not present

## 2019-09-21 DIAGNOSIS — H04123 Dry eye syndrome of bilateral lacrimal glands: Secondary | ICD-10-CM | POA: Diagnosis not present

## 2019-10-01 ENCOUNTER — Telehealth: Payer: Self-pay | Admitting: Family Medicine

## 2019-10-01 NOTE — Telephone Encounter (Signed)
Called patient - not accepting calls at this time, no VM

## 2019-10-05 DIAGNOSIS — E119 Type 2 diabetes mellitus without complications: Secondary | ICD-10-CM | POA: Diagnosis not present

## 2019-10-05 DIAGNOSIS — Z961 Presence of intraocular lens: Secondary | ICD-10-CM | POA: Diagnosis not present

## 2019-10-05 DIAGNOSIS — H04123 Dry eye syndrome of bilateral lacrimal glands: Secondary | ICD-10-CM | POA: Diagnosis not present

## 2019-10-06 ENCOUNTER — Ambulatory Visit (INDEPENDENT_AMBULATORY_CARE_PROVIDER_SITE_OTHER): Payer: Medicare Other

## 2019-10-06 ENCOUNTER — Other Ambulatory Visit: Payer: Self-pay

## 2019-10-06 ENCOUNTER — Encounter: Payer: Self-pay | Admitting: Family Medicine

## 2019-10-06 ENCOUNTER — Ambulatory Visit (INDEPENDENT_AMBULATORY_CARE_PROVIDER_SITE_OTHER): Payer: Medicare Other | Admitting: Family Medicine

## 2019-10-06 VITALS — BP 129/79 | HR 74 | Temp 98.4°F | Ht 65.0 in | Wt 145.0 lb

## 2019-10-06 DIAGNOSIS — M25512 Pain in left shoulder: Secondary | ICD-10-CM | POA: Diagnosis not present

## 2019-10-06 DIAGNOSIS — M25511 Pain in right shoulder: Secondary | ICD-10-CM

## 2019-10-06 DIAGNOSIS — Z780 Asymptomatic menopausal state: Secondary | ICD-10-CM

## 2019-10-06 DIAGNOSIS — M810 Age-related osteoporosis without current pathological fracture: Secondary | ICD-10-CM | POA: Diagnosis not present

## 2019-10-06 DIAGNOSIS — M19011 Primary osteoarthritis, right shoulder: Secondary | ICD-10-CM | POA: Diagnosis not present

## 2019-10-06 DIAGNOSIS — M19012 Primary osteoarthritis, left shoulder: Secondary | ICD-10-CM | POA: Diagnosis not present

## 2019-10-06 MED ORDER — DICLOFENAC SODIUM 75 MG PO TBEC: 75.0000 mg | DELAYED_RELEASE_TABLET | Freq: Two times a day (BID) | ORAL | 2 refills | Status: DC

## 2019-10-06 MED ORDER — BETAMETHASONE SOD PHOS & ACET 6 (3-3) MG/ML IJ SUSP
6.0000 mg | Freq: Once | INTRAMUSCULAR | Status: AC
  Administered 2019-10-06: 6 mg via INTRAMUSCULAR

## 2019-10-06 MED ORDER — ALENDRONATE SODIUM 70 MG PO TABS: 70.0000 mg | ORAL_TABLET | ORAL | 11 refills | Status: DC

## 2019-10-06 NOTE — Progress Notes (Signed)
Subjective:  Patient ID: Brianna Nichols, female    DOB: 1951-06-07  Age: 69 y.o. MRN: [id number]  CC: Shoulder Pain (Bilateral, radiates down arm)   HPI Brianna Nichols presents for bilateral shoulder pain.  She has had no known injury.  She did have remote fractures of both upper arms.  But nothing acute or current.  This happened about 4 years ago.  The pain occurs primarily in the morning upon awakening.  It lasts just a short time and then goes away.  It can come back though later in the day.  Particularly after a lot of use.  Depression screen [facility] 2/9 10/06/2019 12/01/2018 07/08/2018  Decreased Interest 0 0 0  Down, Depressed, Hopeless 0 1 0  PHQ - 2 Score 0 1 0    History Brianna Nichols has a past medical history of Asthma, Cataract, Diverticulitis, Eczema, Hyperlipidemia, Obstructive sleep apnea, Pre-diabetes, and Psoriasis.   She has a past surgical history that includes Hysterotomy; Abdominal hysterectomy; Colon surgery; Cholecystectomy; Larynx surgery; and Eye surgery.   Her family history includes Cancer in her mother and sister; Coronary artery disease in her father, sister, sister, sister, and sister; Diabetes in her daughter; Heart attack in her brother and father.She reports that she quit smoking about 2 years ago. Her smoking use included cigarettes. She has a 30.00 pack-year smoking history. She has never used smokeless tobacco. She reports that she does not drink alcohol or use drugs.    ROS Review of Systems  Constitutional: Negative.   HENT: Negative.   Eyes: Negative for visual disturbance.  Respiratory: Negative for shortness of breath.   Cardiovascular: Negative for chest pain.  Gastrointestinal: Negative for abdominal pain.  Musculoskeletal: Positive for arthralgias.    Objective:  BP 129/79   Pulse 74   Temp 98.4 F (36.9 C) (Temporal)   Ht '5\' 5"'  (1.651 m)   Wt 145 lb (65.8 kg)   BMI 24.13 kg/m   BP Readings from Last 3 Encounters:  10/06/19 129/79  01/06/19  (!) 154/82  12/30/18 133/84    Wt Readings from Last 3 Encounters:  10/06/19 145 lb (65.8 kg)  01/06/19 147 lb (66.7 kg)  12/30/18 143 lb (64.9 kg)     Physical Exam Constitutional:      Appearance: She is well-developed.  HENT:     Head: Normocephalic.  Cardiovascular:     Rate and Rhythm: Normal rate and regular rhythm.     Heart sounds: No murmur.  Pulmonary:     Effort: Pulmonary effort is normal.     Breath sounds: Normal breath sounds.  Musculoskeletal:        General: Tenderness (For abducted rotation of the right shoulder and external rotation of the left shoulder.) present.    DEXA scan today showed evidence for osteoporosis. Preliminary review of the x-ray of each shoulder shows arthritis no acute changes  Assessment & Plan:   Brianna Nichols was seen today for shoulder pain.  Diagnoses and all orders for this visit:  Acute pain of both shoulders -     betamethasone acetate-betamethasone sodium phosphate (CELESTONE) injection 6 mg -     DG Shoulder Right; Future -     DG Shoulder Left; Future  Age-related osteoporosis without current pathological fracture  Other orders -     diclofenac (VOLTAREN) 75 MG EC tablet; Take 1 tablet (75 mg total) by mouth 2 (two) times daily. For muscle and  Joint pain -     alendronate (FOSAMAX)  70 MG tablet; Take 1 tablet (70 mg total) by mouth every 7 (seven) days. Take with a full glass of water on an empty stomach. Do not lay down for at least 2 hours       I have discontinued Brianna Nichols's diclofenac sodium, HYDROmorphone, and traMADol. I am also having her start on diclofenac and alendronate. Additionally, I am having her maintain her Cyanocobalamin (VITAMIN B-12 CR PO), Vitamin D3, fluocinonide cream, albuterol, albuterol, umeclidinium-vilanterol, blood glucose meter kit and supplies, simvastatin, citalopram, omeprazole, Fenofibric Acid, omega-3 acid ethyl esters, and ALPRAZolam. We administered betamethasone  acetate-betamethasone sodium phosphate.  Allergies as of 10/06/2019      Reactions   Gabapentin Anaphylaxis   Morphine Other (See Comments)   Felt like body was folding over per pt Out-of-body experience   Nalbuphine Other (See Comments)   Felt like body was folding over per pt Out-of-body experience   Penicillins Anaphylaxis   anaphylaxis anaphylaxis   Diphenhydramine Hcl Other (See Comments)   Chest tightness   Diphenhydramine Hcl (sleep) Other (See Comments)   Other reaction(s): Other (See Comments) "chest tightness" "chest tightness"   Hydrocodone-acetaminophen    Other reaction(s): Other (See Comments) Ended up in ICU; Dilaudid okay.   Levofloxacin    Other reaction(s): Muscle Pain, Myalgias (intolerance) Muscular problems Muscular problems   Pollen Extract Other (See Comments)   Stuffy nose Stuffy nose   Metronidazole Itching   Facial swelling Facial swelling   Moxifloxacin Nausea Only, Other (See Comments)   Lightheaded per patient Lightheaded per patient   Sulfamethoxazole Rash   Tape Rash   rash rash      Medication List       Accurate as of October 06, 2019  6:17 PM. If you have any questions, ask your nurse or doctor.        STOP taking these medications   diclofenac sodium 1 % Gel Commonly known as: VOLTAREN Stopped by: Claretta Fraise, MD   HYDROmorphone 2 MG tablet Commonly known as: Dilaudid Stopped by: Claretta Fraise, MD   traMADol 50 MG tablet Commonly known as: ULTRAM Stopped by: Claretta Fraise, MD     TAKE these medications   albuterol 108 (90 Base) MCG/ACT inhaler Commonly known as: ProAir HFA INHALE 2 PUFFS INTO LUNGS EVERY FOUR HOURS AS NEEDED   albuterol (2.5 MG/3ML) 0.083% nebulizer solution Commonly known as: PROVENTIL Inhale 3 mLs (2.5 mg total) into the lungs every 6 (six) hours as needed for wheezing or shortness of breath.   alendronate 70 MG tablet Commonly known as: FOSAMAX Take 1 tablet (70 mg total) by mouth every 7  (seven) days. Take with a full glass of water on an empty stomach. Do not lay down for at least 2 hours Started by: Claretta Fraise, MD   ALPRAZolam 0.5 MG tablet Commonly known as: XANAX Take 1 tablet (0.5 mg total) by mouth as needed for anxiety.   blood glucose meter kit and supplies Dispense based on patient and insurance preference. Use up to four times daily as directed. (FOR ICD-10 E10.9, E11.9).   citalopram 20 MG tablet Commonly known as: CELEXA TAKE 1 TABLET BY MOUTH DAILY   diclofenac 75 MG EC tablet Commonly known as: VOLTAREN Take 1 tablet (75 mg total) by mouth 2 (two) times daily. For muscle and  Joint pain Started by: Claretta Fraise, MD   Fenofibric Acid 45 MG Cpdr TAKE ONE CAPSULE BY MOUTH DAILY FOR high cholesterol (Needs to be seen before next refill)  fluocinonide cream 0.05 % Commonly known as: LIDEX Apply to hand eczema twice a day as needed for rash   omega-3 acid ethyl esters 1 g capsule Commonly known as: LOVAZA Take 2 capsules (2 g total) by mouth 2 (two) times daily. (Needs to be seen before next refill)   omeprazole 40 MG capsule Commonly known as: PRILOSEC Take 1 capsule (40 mg total) by mouth daily. (Needs to be seen before next refill)   simvastatin 20 MG tablet Commonly known as: ZOCOR TAKE 1 TABLET BY MOUTH DAILY (Needs to be seen before next refill)   umeclidinium-vilanterol 62.5-25 MCG/INH Aepb Commonly known as: Anoro Ellipta One puff daily   VITAMIN B-12 CR PO Chew 1 gummy daily   Vitamin D3 75 MCG (3000 UT) Tabs Take by mouth.      Ms. Fontenot's shoulder pain and is almost certainly related to arthritis.  She did get good relief in the past from Voltaren gel but she needs a prescription strength at this time.  So ahead with diclofenac oral.  Additionally she needs protection from her osteoporosis.  Fosamax has been ordered.  Follow-up in 1 month for progress with the pain and tolerance of the medication.  Follow-up: Return in about  1 month (around 11/06/2019).  Claretta Fraise, M.D.

## 2019-10-09 NOTE — Telephone Encounter (Signed)
Patient had done at office visit on 10/06/19

## 2019-10-28 LAB — HM DIABETES EYE EXAM

## 2019-11-17 ENCOUNTER — Other Ambulatory Visit: Payer: Self-pay

## 2019-11-17 ENCOUNTER — Ambulatory Visit (INDEPENDENT_AMBULATORY_CARE_PROVIDER_SITE_OTHER): Payer: Medicare Other | Admitting: Family Medicine

## 2019-11-17 ENCOUNTER — Encounter: Payer: Self-pay | Admitting: Family Medicine

## 2019-11-17 VITALS — BP 133/73 | HR 80 | Temp 97.0°F | Resp 20 | Ht 65.0 in | Wt 142.1 lb

## 2019-11-17 DIAGNOSIS — M810 Age-related osteoporosis without current pathological fracture: Secondary | ICD-10-CM | POA: Diagnosis not present

## 2019-11-17 DIAGNOSIS — F419 Anxiety disorder, unspecified: Secondary | ICD-10-CM

## 2019-11-17 DIAGNOSIS — R42 Dizziness and giddiness: Secondary | ICD-10-CM

## 2019-11-17 DIAGNOSIS — F17210 Nicotine dependence, cigarettes, uncomplicated: Secondary | ICD-10-CM

## 2019-11-17 DIAGNOSIS — R7303 Prediabetes: Secondary | ICD-10-CM | POA: Diagnosis not present

## 2019-11-17 DIAGNOSIS — D6859 Other primary thrombophilia: Secondary | ICD-10-CM | POA: Diagnosis not present

## 2019-11-17 DIAGNOSIS — E782 Mixed hyperlipidemia: Secondary | ICD-10-CM

## 2019-11-17 LAB — BAYER DCA HB A1C WAIVED: HB A1C (BAYER DCA - WAIVED): 6.8 % (ref <7.0)

## 2019-11-17 MED ORDER — CITALOPRAM HYDROBROMIDE 40 MG PO TABS: 40.0000 mg | ORAL_TABLET | Freq: Every day | ORAL | 1 refills | Status: DC

## 2019-11-17 NOTE — Progress Notes (Signed)
Subjective:  Patient ID: Brianna Nichols, female    DOB: 14-Jun-1951  Age: 69 y.o. MRN: [id number]  CC: No chief complaint on file.   HPI Brianna Nichols presents for follow-up on shoulder pain.  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  Patient in for follow-up of elevated cholesterol. Doing well without complaints on current medication. Denies side effects of statin including myalgia and arthralgia and nausea. Also in today for liver function testing. Currently no chest pain, shortness of breath or other cardiovascular related symptoms noted.  Depression screen [facility] 2/9 11/17/2019 10/06/2019 12/01/2018  Decreased Interest 0 0 0  Down, Depressed, Hopeless 1 0 1  PHQ - 2 Score 1 0 1    History [name] has a past medical history of Asthma, Cataract, Diverticulitis, Eczema, Hyperlipidemia, Obstructive sleep apnea, Pre-diabetes, and Psoriasis.   She has a past surgical history that includes Hysterotomy; Abdominal hysterectomy; Colon surgery; Cholecystectomy; Larynx surgery; and Eye surgery.   Her family history includes Cancer in her mother and sister; Coronary artery disease in her father, sister, sister, sister, and sister; Diabetes in her daughter; Heart attack in her brother and father.She reports that she quit smoking about 2 years ago. Her smoking use included cigarettes. She has a 30.00 pack-year smoking history. She has never used smokeless tobacco. She reports that she does not drink alcohol or use drugs.    ROS Review of Systems  Constitutional: Negative.   HENT: Negative.   Eyes: Negative for visual disturbance.  Respiratory: Negative for shortness of breath.   Cardiovascular: Negative for chest pain.  Gastrointestinal: Negative for abdominal pain.  Musculoskeletal: Negative for arthralgias.    Objective:  BP 133/73   Pulse 80   Temp (!) 97 F (36.1 C) (Temporal)   Resp 20   Ht '5\' 5"'  (1.651 m)   Wt 142 lb 2 oz (64.5 kg)   SpO2 99%   BMI 23.65 kg/m   BP Readings  from Last 3 Encounters:  11/17/19 133/73  10/06/19 129/79  01/06/19 (!) 154/82    Wt Readings from Last 3 Encounters:  11/17/19 142 lb 2 oz (64.5 kg)  10/06/19 145 lb (65.8 kg)  01/06/19 147 lb (66.7 kg)     Physical Exam Constitutional:      General: She is not in acute distress.    Appearance: She is well-developed.  HENT:     Head: Normocephalic and atraumatic.  Eyes:     Conjunctiva/sclera: Conjunctivae normal.     Pupils: Pupils are equal, round, and reactive to light.  Neck:     Thyroid: No thyromegaly.  Cardiovascular:     Rate and Rhythm: Normal rate and regular rhythm.     Heart sounds: Normal heart sounds. No murmur.  Pulmonary:     Effort: Pulmonary effort is normal. No respiratory distress.     Breath sounds: Normal breath sounds. No wheezing or rales.  Abdominal:     General: Bowel sounds are normal. There is no distension.     Palpations: Abdomen is soft.     Tenderness: There is no abdominal tenderness.  Musculoskeletal:        General: Normal range of motion.     Cervical back: Normal range of motion and neck supple.  Lymphadenopathy:     Cervical: No cervical adenopathy.  Skin:    General: Skin is warm and dry.  Neurological:     Mental Status: She is alert and oriented to person, place, and time.  Psychiatric:        Behavior: Behavior normal.        Thought Content: Thought content normal.        Judgment: Judgment normal.       Assessment & Plan:   Diagnoses and all orders for this visit:  Age-related osteoporosis without current pathological fracture -     CBC with Differential/Platelet -     CMP14+EGFR -     TSH  Anxiety -     citalopram (CELEXA) 40 MG tablet; Take 1 tablet (40 mg total) by mouth daily. -     CBC with Differential/Platelet -     CMP14+EGFR -     TSH  Cigarette smoker  Mixed hyperlipidemia -     CBC with Differential/Platelet -     CMP14+EGFR -     TSH -     Lipid panel  Prediabetes -     Bayer DCA Hb  A1c Waived -     CBC with Differential/Platelet -     CMP14+EGFR -     TSH -     Lipid panel  Primary hypercoagulable state ([place]) -     CBC with Differential/Platelet -     CMP14+EGFR -     TSH  Dizziness -     Bayer DCA Hb A1c Waived -     CBC with Differential/Platelet -     CMP14+EGFR -     TSH       I have discontinued Thersia A. Kustra's Cyanocobalamin (VITAMIN B-12 CR PO), Vitamin D3, fluocinonide cream, umeclidinium-vilanterol, and omeprazole. I have also changed her citalopram. Additionally, I am having her maintain her albuterol, blood glucose meter kit and supplies, simvastatin, Fenofibric Acid, omega-3 acid ethyl esters, ALPRAZolam, diclofenac, and alendronate.  Allergies as of 11/17/2019      Reactions   Gabapentin Anaphylaxis   Morphine Other (See Comments)   Felt like body was folding over per pt Out-of-body experience   Nalbuphine Other (See Comments)   Felt like body was folding over per pt Out-of-body experience   Penicillins Anaphylaxis   anaphylaxis anaphylaxis   Diphenhydramine Hcl Other (See Comments)   Chest tightness   Diphenhydramine Hcl (sleep) Other (See Comments)   Other reaction(s): Other (See Comments) "chest tightness" "chest tightness"   Hydrocodone-acetaminophen    Other reaction(s): Other (See Comments) Ended up in ICU; Dilaudid okay.   Levofloxacin    Other reaction(s): Muscle Pain, Myalgias (intolerance) Muscular problems Muscular problems   Pollen Extract Other (See Comments)   Stuffy nose Stuffy nose   Metronidazole Itching   Facial swelling Facial swelling   Moxifloxacin Nausea Only, Other (See Comments)   Lightheaded per patient Lightheaded per patient   Sulfamethoxazole Rash   Tape Rash   rash rash      Medication List       Accurate as of November 17, 2019  9:30 PM. If you have any questions, ask your nurse or doctor.        STOP taking these medications   fluocinonide cream 0.05 % Commonly known as:  LIDEX Stopped by: Claretta Fraise, MD   omeprazole 40 MG capsule Commonly known as: PRILOSEC Stopped by: Claretta Fraise, MD   umeclidinium-vilanterol 62.5-25 MCG/INH Aepb Commonly known as: Anoro Ellipta Stopped by: Claretta Fraise, MD   VITAMIN B-12 CR PO Stopped by: Claretta Fraise, MD   Vitamin D3 75 MCG (3000 UT) Tabs Stopped by: Claretta Fraise, MD     TAKE these medications  albuterol 108 (90 Base) MCG/ACT inhaler Commonly known as: ProAir HFA INHALE 2 PUFFS INTO LUNGS EVERY FOUR HOURS AS NEEDED What changed: Another medication with the same name was removed. Continue taking this medication, and follow the directions you see here. Changed by: Claretta Fraise, MD   alendronate 70 MG tablet Commonly known as: FOSAMAX Take 1 tablet (70 mg total) by mouth every 7 (seven) days. Take with a full glass of water on an empty stomach. Do not lay down for at least 2 hours   ALPRAZolam 0.5 MG tablet Commonly known as: XANAX Take 1 tablet (0.5 mg total) by mouth as needed for anxiety.   blood glucose meter kit and supplies Dispense based on patient and insurance preference. Use up to four times daily as directed. (FOR ICD-10 E10.9, E11.9).   citalopram 40 MG tablet Commonly known as: CELEXA Take 1 tablet (40 mg total) by mouth daily. What changed:   medication strength  how much to take Changed by: Claretta Fraise, MD   diclofenac 75 MG EC tablet Commonly known as: VOLTAREN Take 1 tablet (75 mg total) by mouth 2 (two) times daily. For muscle and  Joint pain   Fenofibric Acid 45 MG Cpdr TAKE ONE CAPSULE BY MOUTH DAILY FOR high cholesterol (Needs to be seen before next refill)   omega-3 acid ethyl esters 1 g capsule Commonly known as: LOVAZA Take 2 capsules (2 g total) by mouth 2 (two) times daily. (Needs to be seen before next refill)   simvastatin 20 MG tablet Commonly known as: ZOCOR TAKE 1 TABLET BY MOUTH DAILY (Needs to be seen before next refill)        Follow-up:  Return in about 3 months (around 02/16/2020).  Claretta Fraise, M.D.

## 2019-11-18 LAB — TSH: TSH: 0.994 u[IU]/mL (ref 0.450–4.500)

## 2019-11-18 LAB — CBC WITH DIFFERENTIAL/PLATELET
Basophils Absolute: 0 10*3/uL (ref 0.0–0.2)
Basos: 1 %
EOS (ABSOLUTE): 0.1 10*3/uL (ref 0.0–0.4)
Eos: 2 %
Hematocrit: 47.3 % — ABNORMAL HIGH (ref 34.0–46.6)
Hemoglobin: 16.1 g/dL — ABNORMAL HIGH (ref 11.1–15.9)
Immature Grans (Abs): 0 10*3/uL (ref 0.0–0.1)
Immature Granulocytes: 0 %
Lymphocytes Absolute: 1.7 10*3/uL (ref 0.7–3.1)
Lymphs: 24 %
MCH: 31.7 pg (ref 26.6–33.0)
MCHC: 34 g/dL (ref 31.5–35.7)
MCV: 93 fL (ref 79–97)
Monocytes Absolute: 0.4 10*3/uL (ref 0.1–0.9)
Monocytes: 5 %
Neutrophils Absolute: 4.8 10*3/uL (ref 1.4–7.0)
Neutrophils: 68 %
Platelets: 285 10*3/uL (ref 150–450)
RBC: 5.08 x10E6/uL (ref 3.77–5.28)
RDW: 13 % (ref 11.7–15.4)
WBC: 7 10*3/uL (ref 3.4–10.8)

## 2019-11-18 LAB — CMP14+EGFR
ALT: 18 IU/L (ref 0–32)
AST: 21 IU/L (ref 0–40)
Albumin/Globulin Ratio: 1.5 (ref 1.2–2.2)
Albumin: 4.2 g/dL (ref 3.8–4.8)
Alkaline Phosphatase: 95 IU/L (ref 39–117)
BUN/Creatinine Ratio: 15 (ref 12–28)
BUN: 12 mg/dL (ref 8–27)
Bilirubin Total: 0.3 mg/dL (ref 0.0–1.2)
CO2: 24 mmol/L (ref 20–29)
Calcium: 9.4 mg/dL (ref 8.7–10.3)
Chloride: 103 mmol/L (ref 96–106)
Creatinine, Ser: 0.78 mg/dL (ref 0.57–1.00)
GFR calc Af Amer: 90 mL/min/{1.73_m2} (ref >59)
GFR calc non Af Amer: 78 mL/min/{1.73_m2} (ref >59)
Globulin, Total: 2.8 g/dL (ref 1.5–4.5)
Glucose: 112 mg/dL — ABNORMAL HIGH (ref 65–99)
Potassium: 5.2 mmol/L (ref 3.5–5.2)
Sodium: 139 mmol/L (ref 134–144)
Total Protein: 7 g/dL (ref 6.0–8.5)

## 2019-11-18 LAB — LIPID PANEL
Chol/HDL Ratio: 7.3 ratio — ABNORMAL HIGH (ref 0.0–4.4)
Cholesterol, Total: 286 mg/dL — ABNORMAL HIGH (ref 100–199)
HDL: 39 mg/dL — ABNORMAL LOW (ref >39)
LDL Chol Calc (NIH): 181 mg/dL — ABNORMAL HIGH (ref 0–99)
Triglycerides: 337 mg/dL — ABNORMAL HIGH (ref 0–149)
VLDL Cholesterol Cal: 66 mg/dL — ABNORMAL HIGH (ref 5–40)

## 2019-12-02 ENCOUNTER — Other Ambulatory Visit: Payer: Self-pay

## 2019-12-02 ENCOUNTER — Ambulatory Visit (INDEPENDENT_AMBULATORY_CARE_PROVIDER_SITE_OTHER): Payer: Medicare Other | Admitting: *Deleted

## 2019-12-02 DIAGNOSIS — Z0000 Encounter for general adult medical examination without abnormal findings: Secondary | ICD-10-CM | POA: Diagnosis not present

## 2019-12-02 NOTE — Progress Notes (Signed)
MEDICARE ANNUAL WELLNESS VISIT  12/02/2019  Telephone Visit Disclaimer This Medicare AWV was conducted by telephone due to national recommendations for restrictions regarding the COVID-19 Pandemic (e.g. social distancing).  I verified, using two identifiers, that I am speaking with Brianna Nichols or their authorized healthcare agent. I discussed the limitations, risks, security, and privacy concerns of performing an evaluation and management service by telephone and the potential availability of an in-person appointment in the future. The patient expressed understanding and agreed to proceed.   Subjective:  Brianna Nichols is a 69 y.o. female patient of Stacks, Cletus Gash, MD who had a Medicare Annual Wellness Visit today via telephone. Brianna Nichols is retired and lives with her husband Brianna Nichols. They have 2 daughters. She reports that she is socially active and does interact with friends/family regularly. She is minimally physically active and enjoys gardening.  Patient Care Team: Claretta Fraise, MD as PCP - General (Family Medicine) Jorja Loa, MD (Vascular Surgery) Garner Nash, DO as Consulting Physician (Pulmonary Disease)  Advanced Directives 12/02/2019 12/01/2018  Does Patient Have a Medical Advance Directive? No No  Would patient like information on creating a medical advance directive? No - Patient declined Yes (MAU/Ambulatory/Procedural Areas - Information given)    Hospital Utilization Over the Past 12 Months: # of hospitalizations or ER visits: 0 # of surgeries: 0  Review of Systems    Patient reports that her overall health is unchanged compared to last year.  History obtained from the patient and patient chart.   Patient Reported Readings (BP, Pulse, CBG, Weight, etc) none  Pain Assessment Pain : No/denies pain     Current Medications & Allergies (verified) Allergies as of 12/02/2019      Reactions   Gabapentin Anaphylaxis   Morphine Other (See Comments)   Felt like  body was folding over per pt Out-of-body experience   Nalbuphine Other (See Comments)   Felt like body was folding over per pt Out-of-body experience   Penicillins Anaphylaxis   anaphylaxis anaphylaxis   Diphenhydramine Hcl Other (See Comments)   Chest tightness   Diphenhydramine Hcl (sleep) Other (See Comments)   Other reaction(s): Other (See Comments) "chest tightness" "chest tightness"   Hydrocodone-acetaminophen    Other reaction(s): Other (See Comments) Ended up in ICU; Dilaudid okay.   Levofloxacin    Other reaction(s): Muscle Pain, Myalgias (intolerance) Muscular problems Muscular problems   Pollen Extract Other (See Comments)   Stuffy nose Stuffy nose   Metronidazole Itching   Facial swelling Facial swelling   Moxifloxacin Nausea Only, Other (See Comments)   Lightheaded per patient Lightheaded per patient   Sulfamethoxazole Rash   Tape Rash   rash rash      Medication List       Accurate as of Dec 02, 2019 10:53 AM. If you have any questions, ask your nurse or doctor.        albuterol 108 (90 Base) MCG/ACT inhaler Commonly known as: ProAir HFA INHALE 2 PUFFS INTO LUNGS EVERY FOUR HOURS AS NEEDED   alendronate 70 MG tablet Commonly known as: FOSAMAX Take 1 tablet (70 mg total) by mouth every 7 (seven) days. Take with a full glass of water on an empty stomach. Do not lay down for at least 2 hours   ALPRAZolam 0.5 MG tablet Commonly known as: XANAX Take 1 tablet (0.5 mg total) by mouth as needed for anxiety.   blood glucose meter kit and supplies Dispense based on patient and insurance preference.  Use up to four times daily as directed. (FOR ICD-10 E10.9, E11.9).   citalopram 40 MG tablet Commonly known as: CELEXA Take 1 tablet (40 mg total) by mouth daily.   diclofenac 75 MG EC tablet Commonly known as: VOLTAREN Take 1 tablet (75 mg total) by mouth 2 (two) times daily. For muscle and  Joint pain   Fenofibric Acid 45 MG Cpdr TAKE ONE CAPSULE BY  MOUTH DAILY FOR high cholesterol (Needs to be seen before next refill)   omega-3 acid ethyl esters 1 g capsule Commonly known as: LOVAZA Take 2 capsules (2 g total) by mouth 2 (two) times daily. (Needs to be seen before next refill)   simvastatin 20 MG tablet Commonly known as: ZOCOR TAKE 1 TABLET BY MOUTH DAILY (Needs to be seen before next refill)       History (reviewed): Past Medical History:  Diagnosis Date  . Asthma   . Cataract   . Diverticulitis   . Eczema   . Hyperlipidemia   . Obstructive sleep apnea   . Pre-diabetes   . Psoriasis    Past Surgical History:  Procedure Laterality Date  . ABDOMINAL HYSTERECTOMY    . CHOLECYSTECTOMY    . COLON SURGERY    . EYE SURGERY    . HYSTEROTOMY    . LARYNX SURGERY     Family History  Problem Relation Age of Onset  . Cancer Mother        Renal Cancer  . Heart attack Father   . Coronary artery disease Father   . Cancer Sister        Pancreatic Cancer  . Heart attack Brother   . Coronary artery disease Sister   . Coronary artery disease Sister   . Coronary artery disease Sister   . Coronary artery disease Sister   . Diabetes Daughter    Social History   Socioeconomic History  . Marital status: Married    Spouse name: Brianna Nichols  . Number of children: 2  . Years of education: 10  . Highest education level: GED or equivalent  Occupational History  . Occupation: retired    Comment: Tax Therapist, nutritional  Tobacco Use  . Smoking status: Former Smoker    Packs/day: 1.00    Years: 30.00    Pack years: 30.00    Types: Cigarettes    Quit date: 11/20/2016    Years since quitting: 3.0  . Smokeless tobacco: Never Used  Substance and Sexual Activity  . Alcohol use: No  . Drug use: No  . Sexual activity: Not Currently  Other Topics Concern  . Not on file  Social History Narrative  . Not on file   Social Determinants of Health   Financial Resource Strain:   . Difficulty of Paying Living Expenses:   Food Insecurity:   .  Worried About Charity fundraiser in the Last Year:   . Arboriculturist in the Last Year:   Transportation Needs:   . Film/video editor (Medical):   Marland Kitchen Lack of Transportation (Non-Medical):   Physical Activity:   . Days of Exercise per Week:   . Minutes of Exercise per Session:   Stress:   . Feeling of Stress :   Social Connections:   . Frequency of Communication with Friends and Family:   . Frequency of Social Gatherings with Friends and Family:   . Attends Religious Services:   . Active Member of Clubs or Organizations:   . Attends Club or  Organization Meetings:   Marland Kitchen Marital Status:     Activities of Daily Living In your present state of health, do you have any difficulty performing the following activities: 12/02/2019  Hearing? Y  Comment sometimes  Vision? Y  Comment cataracts removed  Difficulty concentrating or making decisions? N  Walking or climbing stairs? N  Dressing or bathing? N  Doing errands, shopping? N  Preparing Food and eating ? N  Using the Toilet? N  In the past six months, have you accidently leaked urine? Y  Comment stress incontinence  Do you have problems with loss of bowel control? Y  Comment urgency, several accidents since intestinal surgery  Managing your Medications? N  Managing your Finances? N  Housekeeping or managing your Housekeeping? N  Some recent data might be hidden    Patient Education/ Literacy How often do you need to have someone help you when you read instructions, pamphlets, or other written materials from your doctor or pharmacy?: 1 - Never What is the last grade level you completed in school?: 10th, GED  Exercise Current Exercise Habits: Structured exercise class, Type of exercise: strength training/weights;Other - see comments;stretching(elliptical), Time (Minutes): 15, Frequency (Times/Week): 7, Weekly Exercise (Minutes/Week): 105, Intensity: Mild, Exercise limited by: None identified  Diet Patient reports consuming 1  meals a day and 3 snack(s) a day Patient reports that her primary diet is: Regular Patient reports that she does have regular access to food.   Depression Screen PHQ 2/9 Scores 12/02/2019 11/17/2019 10/06/2019 12/01/2018 07/08/2018  PHQ - 2 Score 0 1 0 1 0  PHQ- 9 Score 7 - - - -     Fall Risk Fall Risk  12/02/2019 11/17/2019 10/06/2019 12/01/2018 07/08/2018  Falls in the past year? 0 0 0 0 1  Number falls in past yr: - - 0 - 1  Injury with Fall? - - 0 - 1  Risk for fall due to : - - No Fall Risks - -  Follow up - - Falls evaluation completed - -     Objective:  Brianna Nichols seemed alert and oriented and she participated appropriately during our telephone visit.  Blood Pressure Weight BMI  BP Readings from Last 3 Encounters:  11/17/19 133/73  10/06/19 129/79  01/06/19 (!) 154/82   Wt Readings from Last 3 Encounters:  11/17/19 142 lb 2 oz (64.5 kg)  10/06/19 145 lb (65.8 kg)  01/06/19 147 lb (66.7 kg)   BMI Readings from Last 1 Encounters:  11/17/19 23.65 kg/m    *Unable to obtain current vital signs, weight, and BMI due to telephone visit type  Hearing/Vision  . Necola did not seem to have difficulty with hearing/understanding during the telephone conversation . Reports that she has had a formal eye exam by an eye care professional within the past year . Reports that she has not had a formal hearing evaluation within the past year *Unable to fully assess hearing and vision during telephone visit type  Cognitive Function: 6CIT Screen 12/02/2019 12/01/2018  What Year? 0 points 0 points  What month? 0 points 0 points  What time? 0 points 0 points  Count back from 20 0 points 0 points  Months in reverse 0 points 0 points  Repeat phrase 0 points 0 points  Total Score 0 0   (Normal:0-7, Significant for Dysfunction: >8)  Normal Cognitive Function Screening: Yes   Immunization & Health Maintenance Record Immunization History  Administered Date(s) Administered  . Td  03/26/2001  .  Tdap 03/30/2010    Health Maintenance  Topic Date Due  . URINE MICROALBUMIN  Never done  . INFLUENZA VACCINE  02/21/2020  . MAMMOGRAM  02/02/2021  . COLONOSCOPY  04/26/2021  . TETANUS/TDAP  05/27/2025  . DEXA SCAN  Completed  . Hepatitis C Screening  Completed  . COVID-19 Vaccine  Discontinued  . PNA vac Low Risk Adult  Discontinued       Assessment  This is a routine wellness examination for [employer].  Health Maintenance: Due or Overdue Health Maintenance Due  Topic Date Due  . URINE MICROALBUMIN  Never done    Brianna Nichols does not need a referral for Community Assistance: Care Management:   no Social Work:    no Prescription Assistance:  no Nutrition/Diabetes Education:  no   Plan:  Personalized Goals Goals Addressed            This Visit's Progress   . Patient Stated       12/02/2019 AWV Goal: Keep All Scheduled Appointments  Over the next year, patient will attend all scheduled appointments with their PCP and any specialists that they see.       Personalized Health Maintenance & Screening Recommendations    Lung Cancer Screening Recommended: no (Low Dose CT Chest recommended if Age 62-80 years, 30 pack-year currently smoking OR have quit w/in past 15 years) Hepatitis C Screening recommended: no HIV Screening recommended: no  Advanced Directives: Written information was not prepared per patient's request.  Referrals & Orders No orders of the defined types were placed in this encounter.   Follow-up Plan . Follow-up with Claretta Fraise, MD as planned   I have personally reviewed and noted the following in the patient's chart:   . Medical and social history . Use of alcohol, tobacco or illicit drugs  . Current medications and supplements . Functional ability and status . Nutritional status . Physical activity . Advanced directives . List of other physicians . Hospitalizations, surgeries, and ER visits in previous 12  months . Vitals . Screenings to include cognitive, depression, and falls . Referrals and appointments  In addition, I have reviewed and discussed with Brianna Nichols certain preventive protocols, quality metrics, and best practice recommendations. A written personalized care plan for preventive services as well as general preventive health recommendations is available and can be mailed to the patient at her request.      Baldomero Lamy, LPN  01/03/4314

## 2019-12-20 ENCOUNTER — Telehealth: Payer: Self-pay | Admitting: Family Medicine

## 2019-12-20 ENCOUNTER — Ambulatory Visit
Admission: RE | Admit: 2019-12-20 | Discharge: 2019-12-20 | Disposition: A | Discharge status: Home / Self Care | Payer: Self-pay | Source: Ambulatory Visit

## 2019-12-20 ENCOUNTER — Telehealth: Payer: Medicare Other | Admitting: Nurse Practitioner

## 2019-12-20 DIAGNOSIS — J029 Acute pharyngitis, unspecified: Secondary | ICD-10-CM | POA: Diagnosis not present

## 2019-12-20 MED ORDER — AZITHROMYCIN 250 MG PO TABS: ORAL_TABLET | ORAL | 0 refills | Status: DC

## 2019-12-20 MED ORDER — AZITHROMYCIN 250 MG PO TABS
ORAL_TABLET | ORAL | 0 refills | Status: DC
Start: 2019-12-20 — End: 2019-12-20

## 2019-12-20 NOTE — ED Provider Notes (Signed)
Phone call to change the pharmacy for the z-pack that she was sent in earlier today from her primary doctor. Sent to [facility].   Moshe Cipro, NP 12/20/19 1454

## 2019-12-20 NOTE — Progress Notes (Signed)

## 2019-12-20 NOTE — Addendum Note (Signed)
Addended by: Bennie Pierini on: 12/20/2019 02:59 PM   Modules accepted: Orders

## 2019-12-24 ENCOUNTER — Telehealth: Payer: Self-pay | Admitting: Family Medicine

## 2019-12-24 DIAGNOSIS — J984 Other disorders of lung: Secondary | ICD-10-CM | POA: Diagnosis not present

## 2019-12-24 DIAGNOSIS — J441 Chronic obstructive pulmonary disease with (acute) exacerbation: Secondary | ICD-10-CM | POA: Diagnosis not present

## 2019-12-24 DIAGNOSIS — M25512 Pain in left shoulder: Secondary | ICD-10-CM | POA: Diagnosis not present

## 2019-12-24 DIAGNOSIS — R918 Other nonspecific abnormal finding of lung field: Secondary | ICD-10-CM | POA: Diagnosis not present

## 2019-12-24 DIAGNOSIS — R110 Nausea: Secondary | ICD-10-CM | POA: Diagnosis not present

## 2019-12-24 DIAGNOSIS — R05 Cough: Secondary | ICD-10-CM | POA: Diagnosis not present

## 2019-12-24 DIAGNOSIS — R0602 Shortness of breath: Secondary | ICD-10-CM | POA: Diagnosis not present

## 2019-12-24 NOTE — Telephone Encounter (Signed)
She needs to be seen. I have no openings. Can the on call person work her in? If not, urgent care is next best.

## 2019-12-24 NOTE — Telephone Encounter (Signed)
Patient ended up going to Urgent Care sitting in parking lot. Spoke with patient and advised I still thought it was a good idea she go and get a chest xray. She verbalized understanding because she had only talked to Doctors and nurses over the phone. Patient agreed and is going to be seen there. Still wanted Dr. Darlyn Nichols advise.

## 2019-12-24 NOTE — Telephone Encounter (Signed)
Pt calling back and states she went to urgent care. Dx with chronic bronchitis, had covid test. Pt given doxycycline, Toradol, and steroid injection in hip as well. Went to [facility].

## 2019-12-24 NOTE — Telephone Encounter (Signed)
Lmtcb.

## 2019-12-24 NOTE — Telephone Encounter (Signed)
Patient states she called one day and spoke with Dr. Darlyn Read when the office was closed. He gave her Zpack she took the last one today. She feels worse sweating a lot does not know if she has a fever she has not checked. Patient C/O fatigue productive cough sometimes she can get stuff up other times she gets chocked at times. Is short of breath at times. Cant sleep because coughing every 15 mins. Suggested patient go to Urgent care she states because of insurance she can not. Patient is taking mucenix with a full glass of water and does not see any relief. Please advise on what she can do.

## 2019-12-25 NOTE — Telephone Encounter (Signed)
FYI for provider. No response necessary. 

## 2020-01-12 ENCOUNTER — Telehealth: Payer: Self-pay | Admitting: Family Medicine

## 2020-01-12 NOTE — Telephone Encounter (Signed)
  REFERRAL REQUEST Telephone Note 01/12/2020  What type of referral do you need? Sleep study  Have you been seen at our office for this problem? no (Advise that they may need an appointment with their PCP before a referral can be done)  Is there a particular doctor or location that you prefer? [place] is possible. She is wanting to see if the sleep study can be done at home. Pt is wanting a new CPAP.  Patient notified that referrals can take up to a week or longer to process. If they haven't heard anything within a week they should call back and speak with the referral department.

## 2020-01-14 NOTE — Telephone Encounter (Signed)
Pt called back and I advised she would ntbs for sleep apnea since she hasn't been seen for this in the past at our office. Pt scheduled with Dr Darlyn Read 01/18/20 at 10:55, will close encounter.

## 2020-01-18 ENCOUNTER — Other Ambulatory Visit: Payer: Self-pay

## 2020-01-18 ENCOUNTER — Ambulatory Visit (INDEPENDENT_AMBULATORY_CARE_PROVIDER_SITE_OTHER): Payer: Medicare Other | Admitting: Family Medicine

## 2020-01-18 ENCOUNTER — Encounter: Payer: Self-pay | Admitting: Family Medicine

## 2020-01-18 VITALS — BP 116/76 | HR 86 | Temp 97.6°F | Resp 18 | Ht 65.0 in | Wt 140.4 lb

## 2020-01-18 DIAGNOSIS — S43422A Sprain of left rotator cuff capsule, initial encounter: Secondary | ICD-10-CM | POA: Diagnosis not present

## 2020-01-18 DIAGNOSIS — R0683 Snoring: Secondary | ICD-10-CM

## 2020-01-18 MED ORDER — PREDNISONE 10 MG PO TABS
ORAL_TABLET | ORAL | 0 refills | Status: DC
Start: 2020-01-18 — End: 2020-02-17

## 2020-01-18 NOTE — Progress Notes (Signed)
Subjective:  Patient ID: Brianna Nichols, female    DOB: 05-02-1951  Age: 69 y.o. MRN: [id number]  CC: Referral (Sleep study) and Snoring   HPI Brianna Nichols presents for she has been on CPAP in the past but did not use it much.  As a result she turned in the machine about a year ago.  She does not get restorative sleep very often.  Last week on a beach trip with her daughter, she snored so much that her daughter radiated her.  The snoring was very very loud and there were apneic spells caught on the video .  Patient realizes she needs to get back on her CPAP.  This time she wants to use it more regularly.  She would like to go for a new sleep study.  Patient also had an episode where she had to hold her husband up he got stuck on a swimming pool ladder and was about to fall into the pool.  He could not get himself free so she had to hold it in position for about 15 minutes while help could arrive to dislodge him.  Since that time she has had increasing pain in the left shoulder.  She went to urgent care and had x-rays.  No report has been provided for the.  However she was told that it was a rotator cuff problem.  She does not remember the exact problem.  She has been taking diclofenac for without relief.  Pain is exquisite.  She has basically no range of motion currently with the left shoulder.  Depression screen [facility] 2/9 01/18/2020 12/02/2019 11/17/2019  Decreased Interest 0 0 0  Down, Depressed, Hopeless 0 0 1  PHQ - 2 Score 0 0 1  Altered sleeping - 3 -  Tired, decreased energy - 1 -  Change in appetite - 3 -  Feeling bad or failure about yourself  - 0 -  Trouble concentrating - 0 -  Moving slowly or fidgety/restless - 0 -  Suicidal thoughts - 0 -  PHQ-9 Score - 7 -  Difficult doing work/chores - Not difficult at all -    History Brianna Nichols has a past medical history of Asthma, Cataract, Diverticulitis, Eczema, Hyperlipidemia, Obstructive sleep apnea, Pre-diabetes, and Psoriasis.   She has a  past surgical history that includes Hysterotomy; Abdominal hysterectomy; Colon surgery; Cholecystectomy; Larynx surgery; and Eye surgery.   Her family history includes Cancer in her mother and sister; Coronary artery disease in her father, sister, sister, sister, and sister; Diabetes in her daughter; Heart attack in her brother and father.She reports that she quit smoking about 3 years ago. Her smoking use included cigarettes. She has a 30.00 pack-year smoking history. She has never used smokeless tobacco. She reports that she does not drink alcohol and does not use drugs.    ROS Review of Systems  Constitutional: Negative.   HENT: Negative.   Eyes: Negative for visual disturbance.  Respiratory: Negative for shortness of breath.   Cardiovascular: Negative for chest pain.  Gastrointestinal: Negative for abdominal pain.  Musculoskeletal: Positive for arthralgias.  Psychiatric/Behavioral: Positive for sleep disturbance.    Objective:  BP 116/76   Pulse 86   Temp 97.6 F (36.4 C) (Temporal)   Resp 18   Ht '5\' 5"'  (1.651 m)   Wt 140 lb 6.4 oz (63.7 kg)   SpO2 97%   BMI 23.36 kg/m   BP Readings from Last 3 Encounters:  01/18/20 116/76  11/17/19 133/73  10/06/19 129/79    Wt Readings from Last 3 Encounters:  01/18/20 140 lb 6.4 oz (63.7 kg)  11/17/19 142 lb 2 oz (64.5 kg)  10/06/19 145 lb (65.8 kg)     Physical Exam Constitutional:      Appearance: She is well-developed.  HENT:     Head: Normocephalic.  Cardiovascular:     Rate and Rhythm: Normal rate and regular rhythm.     Heart sounds: No murmur heard.   Pulmonary:     Effort: Pulmonary effort is normal.     Breath sounds: Normal breath sounds.  Musculoskeletal:        General: Tenderness (Left shoulder for palpation of the anterior joint..  Range of motion is neutral only for external rotation and about 5 degrees only for abduction.) present.       Assessment & Plan:   Brianna Nichols was seen today for referral and  snoring.  Diagnoses and all orders for this visit:  Sprain of left rotator cuff capsule, initial encounter -     Ambulatory referral to Orthopedics -     Ambulatory referral to Physical Therapy  Loud snoring -     Ambulatory referral to Sleep Studies  Other orders -     predniSONE (DELTASONE) 10 MG tablet; Take 5 daily for 2 days followed by 4,3,2 and 1 for 2 days each.       I have discontinued Brianna Nichols's azithromycin. I am also having her start on predniSONE. Additionally, I am having her maintain her albuterol, blood glucose meter kit and supplies, simvastatin, Fenofibric Acid, omega-3 acid ethyl esters, ALPRAZolam, diclofenac, alendronate, and citalopram.  Allergies as of 01/18/2020      Reactions   Gabapentin Anaphylaxis   Morphine Other (See Comments)   Felt like body was folding over per pt Out-of-body experience   Nalbuphine Other (See Comments)   Felt like body was folding over per pt Out-of-body experience   Penicillins Anaphylaxis   anaphylaxis anaphylaxis   Bee Pollen Other (See Comments)   Stuffy nose Stuffy nose   Diphenhydramine Hcl Other (See Comments)   Chest tightness   Diphenhydramine Hcl (sleep) Other (See Comments)   Other reaction(s): Other (See Comments) "chest tightness" "chest tightness"   Hydrocodone-acetaminophen    Other reaction(s): Other (See Comments) Ended up in ICU; Dilaudid okay.   Levofloxacin    Other reaction(s): Muscle Pain, Myalgias (intolerance) Muscular problems Muscular problems   Pollen Extract Other (See Comments)   Stuffy nose Stuffy nose   Metronidazole Itching   Facial swelling Facial swelling   Moxifloxacin Nausea Only, Other (See Comments)   Lightheaded per patient Lightheaded per patient   Sulfa Antibiotics Rash   rash rash   Sulfamethoxazole Rash   Tape Rash   rash rash      Medication List       Accurate as of January 18, 2020 12:05 PM. If you have any questions, ask your nurse or doctor.          STOP taking these medications   azithromycin 250 MG tablet Commonly known as: Zithromax Z-Pak Stopped by: Claretta Fraise, MD     TAKE these medications   albuterol 108 (90 Base) MCG/ACT inhaler Commonly known as: ProAir HFA INHALE 2 PUFFS INTO LUNGS EVERY FOUR HOURS AS NEEDED   alendronate 70 MG tablet Commonly known as: FOSAMAX Take 1 tablet (70 mg total) by mouth every 7 (seven) days. Take with a full glass of water on an empty stomach.  Do not lay down for at least 2 hours   ALPRAZolam 0.5 MG tablet Commonly known as: XANAX Take 1 tablet (0.5 mg total) by mouth as needed for anxiety.   blood glucose meter kit and supplies Dispense based on patient and insurance preference. Use up to four times daily as directed. (FOR ICD-10 E10.9, E11.9).   citalopram 40 MG tablet Commonly known as: CELEXA Take 1 tablet (40 mg total) by mouth daily.   diclofenac 75 MG EC tablet Commonly known as: VOLTAREN Take 1 tablet (75 mg total) by mouth 2 (two) times daily. For muscle and  Joint pain   Fenofibric Acid 45 MG Cpdr TAKE ONE CAPSULE BY MOUTH DAILY FOR high cholesterol (Needs to be seen before next refill)   omega-3 acid ethyl esters 1 g capsule Commonly known as: LOVAZA Take 2 capsules (2 g total) by mouth 2 (two) times daily. (Needs to be seen before next refill)   predniSONE 10 MG tablet Commonly known as: DELTASONE Take 5 daily for 2 days followed by 4,3,2 and 1 for 2 days each. Started by: Claretta Fraise, MD   simvastatin 20 MG tablet Commonly known as: ZOCOR TAKE 1 TABLET BY MOUTH DAILY (Needs to be seen before next refill)        Follow-up: No follow-ups on file.  Claretta Fraise, M.D.

## 2020-01-21 ENCOUNTER — Other Ambulatory Visit (HOSPITAL_BASED_OUTPATIENT_CLINIC_OR_DEPARTMENT_OTHER): Payer: Self-pay

## 2020-01-21 DIAGNOSIS — R0683 Snoring: Secondary | ICD-10-CM

## 2020-01-21 DIAGNOSIS — R0681 Apnea, not elsewhere classified: Secondary | ICD-10-CM

## 2020-01-29 ENCOUNTER — Other Ambulatory Visit: Payer: Self-pay | Admitting: *Deleted

## 2020-01-29 DIAGNOSIS — Z87891 Personal history of nicotine dependence: Secondary | ICD-10-CM

## 2020-02-01 ENCOUNTER — Telehealth: Payer: Self-pay

## 2020-02-01 NOTE — Telephone Encounter (Signed)
Patient called in wanting to re sch her appointment for [place] office.

## 2020-02-04 ENCOUNTER — Ambulatory Visit: Payer: Medicare Other | Admitting: Orthopaedic Surgery

## 2020-02-07 ENCOUNTER — Encounter (HOSPITAL_BASED_OUTPATIENT_CLINIC_OR_DEPARTMENT_OTHER): Payer: Medicare Other | Admitting: Internal Medicine

## 2020-02-11 ENCOUNTER — Other Ambulatory Visit: Payer: Self-pay

## 2020-02-11 ENCOUNTER — Ambulatory Visit (INDEPENDENT_AMBULATORY_CARE_PROVIDER_SITE_OTHER): Payer: Medicare Other | Admitting: Orthopaedic Surgery

## 2020-02-11 ENCOUNTER — Encounter: Payer: Self-pay | Admitting: Orthopaedic Surgery

## 2020-02-11 DIAGNOSIS — M75122 Complete rotator cuff tear or rupture of left shoulder, not specified as traumatic: Secondary | ICD-10-CM | POA: Insufficient documentation

## 2020-02-11 DIAGNOSIS — S46012A Strain of muscle(s) and tendon(s) of the rotator cuff of left shoulder, initial encounter: Secondary | ICD-10-CM

## 2020-02-11 MED ORDER — LIDOCAINE HCL 1 % IJ SOLN
0.5000 mL | INTRAMUSCULAR | Status: AC | PRN
  Administered 2020-02-11: .5 mL

## 2020-02-11 MED ORDER — BUPIVACAINE HCL 0.25 % IJ SOLN
4.0000 mL | INTRAMUSCULAR | Status: AC | PRN
  Administered 2020-02-11: 4 mL via INTRA_ARTICULAR

## 2020-02-11 MED ORDER — METHYLPREDNISOLONE ACETATE 40 MG/ML IJ SUSP
40.0000 mg | INTRAMUSCULAR | Status: AC | PRN
  Administered 2020-02-11: 40 mg via INTRA_ARTICULAR

## 2020-02-11 NOTE — Progress Notes (Signed)
Office Visit Note   Patient: Brianna Nichols           Date of Birth: 1951-03-01           MRN: [id number] Visit Date: 02/11/2020              Requested by: Mechele Claude, MD [address] [place],  [place] [address] PCP: Mechele Claude, MD   Assessment & Plan: Visit Diagnoses:  1. Traumatic complete tear of left rotator cuff, initial encounter     Plan: Patient has calcific tendinopathy at the supraspinatus insertion site likely has rotator cuff tear.  Subacromial injection performed.  She got some relief but still is having pain and still has weakness.  We will recheck her in 2 weeks if she is having continued problems I would recommend proceeding with MRI scan.  Follow-Up Instructions: No follow-ups on file.   Orders:  Orders Placed This Encounter  Procedures  . Large Joint Inj: L subacromial bursa   No orders of the defined types were placed in this encounter.     Procedures: Large Joint Inj: L subacromial bursa on 02/11/2020 9:24 AM Indications: pain Details: 22 G 1.5 in needle  Arthrogram: No  Medications: 4 mL bupivacaine 0.25 %; 40 mg methylPREDNISolone acetate 40 MG/ML; 0.5 mL lidocaine 1 % Outcome: tolerated well, no immediate complications Procedure, treatment alternatives, risks and benefits explained, specific risks discussed. Consent was given by the patient. Immediately prior to procedure a time out was called to verify the correct patient, procedure, equipment, support staff and site/side marked as required. Patient was prepped and draped in the usual sterile fashion.       Clinical Data: No additional findings.   Subjective: Chief Complaint  Patient presents with  . Left Shoulder - Pain    HPI 69 year old female with more than a month history of pain in the left shoulder inability to abduct.  She has problems with getting her hand to her head to wash her hair and pain that wakes her up at night.  She relates that her husband got stuck on the ladder  at the pool she was trying to help support him for about 10 minutes had increased pain and difficulty since that time..  She has some discomfort in her neck but no numbness or tingling in her hands no gait disturbance.  Patient is right-hand dominant.  Review of Systems All other systems are negative is obtains HPI.  Objective: Vital Signs: BP 129/80   Pulse 82   Ht 5\' 5"  (1.651 m)   Wt 147 lb (66.7 kg)   BMI 24.46 kg/m   Physical Exam Constitutional:      Appearance: She is well-developed.  HENT:     Head: Normocephalic.     Right Ear: External ear normal.     Left Ear: External ear normal.  Eyes:     Pupils: Pupils are equal, round, and reactive to light.  Neck:     Thyroid: No thyromegaly.     Trachea: No tracheal deviation.  Cardiovascular:     Rate and Rhythm: Normal rate.  Pulmonary:     Effort: Pulmonary effort is normal.  Abdominal:     Palpations: Abdomen is soft.  Skin:    General: Skin is warm and dry.  Neurological:     Mental Status: She is alert and oriented to person, place, and time.  Psychiatric:        Behavior: Behavior normal.  Ortho Exam patient has positive impingement abduction only to 40 degrees.  Notes adhesive capsulitis but she has significant pain with abduction or flexion of the shoulder.  Minimal deltoid atrophy no periscapular atrophy.  Mild brachial plexus tenderness right and left negative Spurling.  Sensation hand is intact.  Specialty Comments:  No specialty comments available.  Imaging: CLINICAL DATA:  Acute onset bilateral shoulder pain. No known injury.  EXAM: RIGHT SHOULDER - 2+ VIEW; LEFT SHOULDER - 2+ VIEW  COMPARISON:  None.  FINDINGS: There is no acute bony or joint abnormality. Moderate bilateral acromioclavicular osteoarthritis is worse on the right. Small calcifications over the left greater tuberosity are consistent with calcific rotator cuff tendinopathy. There is a 1.5 cm calcification in the anterior  aspect of the right glenohumeral joint. This could be a loose body in the joint, calcification in the subscapularis tendon or a calcification in the subacromial/subdeltoid bursa.  IMPRESSION: Moderate bilateral acromioclavicular osteoarthritis is worse on the right.  Findings consistent with calcific rotator cuff tendinopathy on the left.  Calcification projecting in the anterior aspect of the right glenohumeral joint could be a loose body in the joint, calcification in the subscapularis tendon or may be due to calcific subacromial/subdeltoid bursitis.   Electronically Signed   By: Drusilla Kanner M.D.   On: 10/07/2019 08:59   PMFS History: Patient Active Problem List   Diagnosis Date Noted  . Complete tear of left rotator cuff 02/11/2020  . Heartburn 08/26/2018  . Post-nasal drip 08/26/2018  . Transient ischemic attack (TIA), and cerebral infarction without residual deficits(V12.54) 06/03/2018  . [place] shoulder syndrome, right 04/09/2018  . Primary osteoarthritis of left knee 07/27/2016  . Age related osteoporosis 07/20/2016  . Closed fracture of distal end of left radius with routine healing 09/01/2014  . Prediabetes 11/18/2013  . Cigarette smoker 09/18/2013  . Right ear pain 09/18/2013  . Abdominal pain 05/21/2013  . Anxiety 04/02/2012  . Centrilobular emphysema (HCC) 04/02/2012  . Status post laparoscopic-assisted sigmoidectomy 10/12/2011  . Primary hypercoagulable state (HCC) 06/29/2011  . Coronary atherosclerosis 04/17/2011  . Mixed hyperlipidemia 04/17/2011  . Unifocal PVCs 04/17/2011  . TOBACCO USER 02/14/2009  . OBSTRUCTIVE SLEEP APNEA 02/14/2009  . TIA 02/14/2009  . ASTHMA 02/14/2009  . PSORIASIS 02/14/2009   Past Medical History:  Diagnosis Date  . Asthma   . Cataract   . Diverticulitis   . Eczema   . Hyperlipidemia   . Obstructive sleep apnea   . Pre-diabetes   . Psoriasis     Family History  Problem Relation Age of Onset  . Cancer  Mother        Renal Cancer  . Heart attack Father   . Coronary artery disease Father   . Cancer Sister        Pancreatic Cancer  . Heart attack Brother   . Coronary artery disease Sister   . Coronary artery disease Sister   . Coronary artery disease Sister   . Coronary artery disease Sister   . Diabetes Daughter     Past Surgical History:  Procedure Laterality Date  . ABDOMINAL HYSTERECTOMY    . CHOLECYSTECTOMY    . COLON SURGERY    . EYE SURGERY    . HYSTEROTOMY    . LARYNX SURGERY     Social History   Occupational History  . 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 Smoking status: Former Smoker    Packs/day: 1.00    Years:  30.00    Pack years: 30.00    Types: Cigarettes    Quit date: 11/20/2016    Years since quitting: 3.2  . Smokeless tobacco: Never Used  Vaping Use  . Vaping Use: Never used  Substance and Sexual Activity  . Alcohol use: No  . Drug use: No  . Sexual activity: Not Currently

## 2020-02-17 ENCOUNTER — Other Ambulatory Visit: Payer: Self-pay

## 2020-02-17 ENCOUNTER — Encounter: Payer: Self-pay | Admitting: Family Medicine

## 2020-02-17 ENCOUNTER — Ambulatory Visit (INDEPENDENT_AMBULATORY_CARE_PROVIDER_SITE_OTHER): Payer: Medicare Other | Admitting: Family Medicine

## 2020-02-17 VITALS — BP 118/78 | HR 78 | Temp 97.7°F | Ht 65.0 in | Wt 139.6 lb

## 2020-02-17 DIAGNOSIS — E119 Type 2 diabetes mellitus without complications: Secondary | ICD-10-CM

## 2020-02-17 DIAGNOSIS — M810 Age-related osteoporosis without current pathological fracture: Secondary | ICD-10-CM | POA: Diagnosis not present

## 2020-02-17 DIAGNOSIS — M25512 Pain in left shoulder: Secondary | ICD-10-CM

## 2020-02-17 DIAGNOSIS — M25511 Pain in right shoulder: Secondary | ICD-10-CM

## 2020-02-17 DIAGNOSIS — E782 Mixed hyperlipidemia: Secondary | ICD-10-CM

## 2020-02-17 DIAGNOSIS — G459 Transient cerebral ischemic attack, unspecified: Secondary | ICD-10-CM

## 2020-02-17 DIAGNOSIS — R7303 Prediabetes: Secondary | ICD-10-CM | POA: Diagnosis not present

## 2020-02-17 DIAGNOSIS — F419 Anxiety disorder, unspecified: Secondary | ICD-10-CM

## 2020-02-17 LAB — CMP14+EGFR
ALT: 15 IU/L (ref 0–32)
AST: 13 IU/L (ref 0–40)
Albumin/Globulin Ratio: 1.4 (ref 1.2–2.2)
Albumin: 4.2 g/dL (ref 3.8–4.8)
Alkaline Phosphatase: 73 IU/L (ref 48–121)
BUN/Creatinine Ratio: 14 (ref 12–28)
BUN: 11 mg/dL (ref 8–27)
Bilirubin Total: 0.3 mg/dL (ref 0.0–1.2)
CO2: 25 mmol/L (ref 20–29)
Calcium: 9.5 mg/dL (ref 8.7–10.3)
Chloride: 100 mmol/L (ref 96–106)
Creatinine, Ser: 0.78 mg/dL (ref 0.57–1.00)
GFR calc Af Amer: 90 mL/min/{1.73_m2} (ref >59)
GFR calc non Af Amer: 78 mL/min/{1.73_m2} (ref >59)
Globulin, Total: 2.9 g/dL (ref 1.5–4.5)
Glucose: 134 mg/dL — ABNORMAL HIGH (ref 65–99)
Potassium: 4.8 mmol/L (ref 3.5–5.2)
Sodium: 140 mmol/L (ref 134–144)
Total Protein: 7.1 g/dL (ref 6.0–8.5)

## 2020-02-17 LAB — CBC WITH DIFFERENTIAL/PLATELET
Basophils Absolute: 0.1 10*3/uL (ref 0.0–0.2)
Basos: 1 %
EOS (ABSOLUTE): 0.1 10*3/uL (ref 0.0–0.4)
Eos: 2 %
Hematocrit: 46 % (ref 34.0–46.6)
Hemoglobin: 15.3 g/dL (ref 11.1–15.9)
Immature Grans (Abs): 0.1 10*3/uL (ref 0.0–0.1)
Immature Granulocytes: 1 %
Lymphocytes Absolute: 1.7 10*3/uL (ref 0.7–3.1)
Lymphs: 25 %
MCH: 31.5 pg (ref 26.6–33.0)
MCHC: 33.3 g/dL (ref 31.5–35.7)
MCV: 95 fL (ref 79–97)
Monocytes Absolute: 0.4 10*3/uL (ref 0.1–0.9)
Monocytes: 6 %
Neutrophils Absolute: 4.4 10*3/uL (ref 1.4–7.0)
Neutrophils: 65 %
Platelets: 337 10*3/uL (ref 150–450)
RBC: 4.86 x10E6/uL (ref 3.77–5.28)
RDW: 13 % (ref 11.7–15.4)
WBC: 6.8 10*3/uL (ref 3.4–10.8)

## 2020-02-17 LAB — BAYER DCA HB A1C WAIVED: HB A1C (BAYER DCA - WAIVED): 7.1 % — ABNORMAL HIGH (ref <7.0)

## 2020-02-18 ENCOUNTER — Encounter: Payer: Self-pay | Admitting: Family Medicine

## 2020-02-18 MED ORDER — DICLOFENAC SODIUM 75 MG PO TBEC: 75.0000 mg | DELAYED_RELEASE_TABLET | Freq: Two times a day (BID) | ORAL | 2 refills | Status: DC

## 2020-02-18 MED ORDER — BLOOD GLUCOSE METER KIT: PACK | 0 refills | Status: DC

## 2020-02-18 MED ORDER — CITALOPRAM HYDROBROMIDE 40 MG PO TABS: 40.0000 mg | ORAL_TABLET | Freq: Every day | ORAL | 1 refills | Status: DC

## 2020-02-18 MED ORDER — OMEGA-3-ACID ETHYL ESTERS 1 G PO CAPS: 2.0000 | ORAL_CAPSULE | Freq: Two times a day (BID) | ORAL | 0 refills | Status: DC

## 2020-02-18 MED ORDER — SIMVASTATIN 20 MG PO TABS: ORAL_TABLET | ORAL | 0 refills | Status: DC

## 2020-02-18 NOTE — Progress Notes (Signed)
Hello Ayshia,  Your lab result is normal and/or stable.Some minor variations that are not significant are commonly marked abnormal, but do not represent any medical problem for you.  Best regards, Mechele Claude, M.D.

## 2020-02-18 NOTE — Progress Notes (Signed)
Subjective:  Patient ID: Brianna Nichols, female    DOB: 11/13/1950  Age: 69 y.o. MRN: [id number]  CC: Follow-up (3 Month)   HPI Brianna Nichols presents for episode occurring yesterday at about 5:30 PM.  She became glassy eyed and her eyes were bloodshot according to witnesses.  Her mouth began to droop and she couldn't talk and she broke into a cold sweat.  The mouth was more drawn to the right she tells me.  The symptoms lasted about 5 minutes and went away she did not have any post ictal symptoms.  She feels better today.  Her one concern now is that her bones are killing me.  She said diclofenac helps a lot.  She is in agony with her shoulders anytime she tries to vacuum or perform a similar task.  Recent blood work shows that she is positive for diabetes.  She has been monitored previously for prediabetes.  She does not want to try Metformin today.  She has never tracked her blood sugar.  Depression screen [facility] 2/9 02/17/2020 01/18/2020 12/02/2019  Decreased Interest 0 0 0  Down, Depressed, Hopeless 0 0 0  PHQ - 2 Score 0 0 0  Altered sleeping - - 3  Tired, decreased energy - - 1  Change in appetite - - 3  Feeling bad or failure about yourself  - - 0  Trouble concentrating - - 0  Moving slowly or fidgety/restless - - 0  Suicidal thoughts - - 0  PHQ-9 Score - - 7  Difficult doing work/chores - - Not difficult at all    History Zareth has a past medical history of Asthma, Cataract, Diverticulitis, Eczema, Hyperlipidemia, Obstructive sleep apnea, Pre-diabetes, and Psoriasis.   She has a past surgical history that includes Hysterotomy; Abdominal hysterectomy; Colon surgery; Cholecystectomy; Larynx surgery; and Eye surgery.   Her family history includes Cancer in her mother and sister; Coronary artery disease in her father, sister, sister, sister, and sister; Diabetes in her daughter; Heart attack in her brother and father.She reports that she quit smoking about 3 years ago. Her smoking use  included cigarettes. She has a 30.00 pack-year smoking history. She has never used smokeless tobacco. She reports that she does not drink alcohol and does not use drugs.    ROS Review of Systems  Constitutional: Negative.   HENT: Negative for congestion.   Eyes: Negative for visual disturbance.  Respiratory: Negative for shortness of breath.   Cardiovascular: Negative for chest pain.  Gastrointestinal: Negative for abdominal pain, constipation, diarrhea, nausea and vomiting.  Endocrine: Negative for cold intolerance, polydipsia, polyphagia and polyuria.  Genitourinary: Negative for difficulty urinating.  Musculoskeletal: Positive for arthralgias and myalgias.  Neurological: Negative for headaches.  Psychiatric/Behavioral: Negative for sleep disturbance.    Objective:  BP 118/78   Pulse 78   Temp 97.7 F (36.5 C) (Temporal)   Ht '5\' 5"'  (1.651 m)   Wt 139 lb 9.6 oz (63.3 kg)   BMI 23.23 kg/m   BP Readings from Last 3 Encounters:  02/17/20 118/78  02/11/20 129/80  01/18/20 116/76    Wt Readings from Last 3 Encounters:  02/17/20 139 lb 9.6 oz (63.3 kg)  02/11/20 147 lb (66.7 kg)  01/18/20 140 lb 6.4 oz (63.7 kg)     Physical Exam Constitutional:      General: She is not in acute distress.    Appearance: She is well-developed.  HENT:     Head: Normocephalic and atraumatic.  Right Ear: External ear normal.     Left Ear: External ear normal.     Nose: Nose normal.  Eyes:     Conjunctiva/sclera: Conjunctivae normal.     Pupils: Pupils are equal, round, and reactive to light.  Neck:     Thyroid: No thyromegaly.  Cardiovascular:     Rate and Rhythm: Normal rate and regular rhythm.     Heart sounds: Normal heart sounds. No murmur heard.   Pulmonary:     Effort: Pulmonary effort is normal. No respiratory distress.     Breath sounds: Normal breath sounds. No wheezing or rales.  Abdominal:     General: Bowel sounds are normal. There is no distension.      Palpations: Abdomen is soft.     Tenderness: There is no abdominal tenderness.  Musculoskeletal:     Cervical back: Normal range of motion and neck supple.  Lymphadenopathy:     Cervical: No cervical adenopathy.  Skin:    General: Skin is warm and dry.  Neurological:     Mental Status: She is alert and oriented to person, place, and time.     Deep Tendon Reflexes: Reflexes are normal and symmetric.  Psychiatric:        Behavior: Behavior normal.        Thought Content: Thought content normal.        Judgment: Judgment normal.       Assessment & Plan:   Komal was seen today for follow-up.  Diagnoses and all orders for this visit:  TIA (transient ischemic attack)  Age-related osteoporosis without current pathological fracture -     CBC with Differential/Platelet -     CMP14+EGFR  Mixed hyperlipidemia -     CBC with Differential/Platelet -     CMP14+EGFR  Diabetes mellitus type 2 in nonobese (HCC) -     CBC with Differential/Platelet -     CMP14+EGFR -     Bayer DCA Hb A1c Waived  Anxiety -     citalopram (CELEXA) 40 MG tablet; Take 1 tablet (40 mg total) by mouth daily.  Acute pain of both shoulders -     diclofenac (VOLTAREN) 75 MG EC tablet; Take 1 tablet (75 mg total) by mouth 2 (two) times daily. For muscle and  Joint pain  Other orders -     omega-3 acid ethyl esters (LOVAZA) 1 g capsule; Take 2 capsules (2 g total) by mouth 2 (two) times daily. (Needs to be seen before next refill) -     blood glucose meter kit and supplies; Dispense based on patient and insurance preference. Use up to four times daily as directed. (FOR ICD-10 E10.9, E11.9). -     simvastatin (ZOCOR) 20 MG tablet; TAKE 1 TABLET BY MOUTH DAILY (Needs to be seen before next refill)       I have discontinued Hellen A. Paullin's predniSONE. I am also having her maintain her albuterol, Fenofibric Acid, ALPRAZolam, alendronate, omega-3 acid ethyl esters, blood glucose meter kit and supplies,  citalopram, diclofenac, and simvastatin.  Allergies as of 02/17/2020      Reactions   Gabapentin Anaphylaxis   Morphine Other (See Comments)   Felt like body was folding over per pt Out-of-body experience   Nalbuphine Other (See Comments)   Felt like body was folding over per pt Out-of-body experience   Penicillins Anaphylaxis   anaphylaxis anaphylaxis   Bee Pollen Other (See Comments)   Stuffy nose Stuffy nose   Diphenhydramine Hcl  Other (See Comments)   Chest tightness   Diphenhydramine Hcl (sleep) Other (See Comments)   Other reaction(s): Other (See Comments) "chest tightness" "chest tightness"   Hydrocodone-acetaminophen    Other reaction(s): Other (See Comments) Ended up in ICU; Dilaudid okay.   Levofloxacin    Other reaction(s): Muscle Pain, Myalgias (intolerance) Muscular problems Muscular problems   Pollen Extract Other (See Comments)   Stuffy nose Stuffy nose   Metronidazole Itching   Facial swelling Facial swelling   Moxifloxacin Nausea Only, Other (See Comments)   Lightheaded per patient Lightheaded per patient   Sulfa Antibiotics Rash   rash rash   Sulfamethoxazole Rash   Tape Rash   rash rash      Medication List       Accurate as of February 17, 2020 11:59 PM. If you have any questions, ask your nurse or doctor.        STOP taking these medications   predniSONE 10 MG tablet Commonly known as: DELTASONE Stopped by: Claretta Fraise, MD     TAKE these medications   albuterol 108 (90 Base) MCG/ACT inhaler Commonly known as: ProAir HFA INHALE 2 PUFFS INTO LUNGS EVERY FOUR HOURS AS NEEDED   alendronate 70 MG tablet Commonly known as: FOSAMAX Take 1 tablet (70 mg total) by mouth every 7 (seven) days. Take with a full glass of water on an empty stomach. Do not lay down for at least 2 hours   ALPRAZolam 0.5 MG tablet Commonly known as: XANAX Take 1 tablet (0.5 mg total) by mouth as needed for anxiety.   blood glucose meter kit and  supplies Dispense based on patient and insurance preference. Use up to four times daily as directed. (FOR ICD-10 E10.9, E11.9).   citalopram 40 MG tablet Commonly known as: CELEXA Take 1 tablet (40 mg total) by mouth daily.   diclofenac 75 MG EC tablet Commonly known as: VOLTAREN Take 1 tablet (75 mg total) by mouth 2 (two) times daily. For muscle and  Joint pain   Fenofibric Acid 45 MG Cpdr TAKE ONE CAPSULE BY MOUTH DAILY FOR high cholesterol (Needs to be seen before next refill)   omega-3 acid ethyl esters 1 g capsule Commonly known as: LOVAZA Take 2 capsules (2 g total) by mouth 2 (two) times daily. (Needs to be seen before next refill)   simvastatin 20 MG tablet Commonly known as: ZOCOR TAKE 1 TABLET BY MOUTH DAILY (Needs to be seen before next refill)      I discussed with the patient that this TIA would potentially lead to a full-blown stroke.  I suggested using Plavix.  However, she would have to discontinue the diclofenac.  She said that she is in agony without the diclofenac and would go on a whole aspirin a day but she declines the Plavix even though she understands the risk that she is taking.  Follow-up: Return in about 3 months (around 05/19/2020), or if symptoms worsen or fail to improve.  Claretta Fraise, M.D.

## 2020-02-19 ENCOUNTER — Other Ambulatory Visit: Payer: Self-pay | Admitting: Family Medicine

## 2020-02-20 ENCOUNTER — Other Ambulatory Visit: Payer: Self-pay | Admitting: Family Medicine

## 2020-02-22 ENCOUNTER — Telehealth: Payer: Self-pay | Admitting: Family Medicine

## 2020-02-22 DIAGNOSIS — M7581 Other shoulder lesions, right shoulder: Secondary | ICD-10-CM | POA: Diagnosis not present

## 2020-02-22 DIAGNOSIS — Z6823 Body mass index (BMI) 23.0-23.9, adult: Secondary | ICD-10-CM | POA: Diagnosis not present

## 2020-02-22 DIAGNOSIS — M25511 Pain in right shoulder: Secondary | ICD-10-CM | POA: Diagnosis not present

## 2020-02-22 NOTE — Telephone Encounter (Signed)
Patient aware and has already went to urgent care.

## 2020-02-22 NOTE — Telephone Encounter (Signed)
Pt was here to see [facility] last week and still having RT shoulder pain. Pt has been taking diclofenac (VOLTAREN) 75 MG EC tablet but no relief. She wants to know if she can get a shot. Please call back

## 2020-02-22 NOTE — Telephone Encounter (Signed)
Will have to schedule appointment

## 2020-02-24 ENCOUNTER — Ambulatory Visit: Payer: Medicare Other | Attending: Family Medicine | Admitting: Neurology

## 2020-02-24 ENCOUNTER — Other Ambulatory Visit: Payer: Self-pay

## 2020-02-24 DIAGNOSIS — Z79899 Other long term (current) drug therapy: Secondary | ICD-10-CM | POA: Diagnosis not present

## 2020-02-24 DIAGNOSIS — G4733 Obstructive sleep apnea (adult) (pediatric): Secondary | ICD-10-CM | POA: Insufficient documentation

## 2020-02-24 DIAGNOSIS — R0681 Apnea, not elsewhere classified: Secondary | ICD-10-CM

## 2020-02-24 DIAGNOSIS — R0683 Snoring: Secondary | ICD-10-CM

## 2020-03-02 ENCOUNTER — Ambulatory Visit (HOSPITAL_COMMUNITY)
Admission: RE | Admit: 2020-03-02 | Discharge: 2020-03-02 | Disposition: A | Discharge status: Home / Self Care | Payer: Medicare Other | Source: Ambulatory Visit | Attending: Acute Care | Admitting: Acute Care

## 2020-03-02 ENCOUNTER — Other Ambulatory Visit: Payer: Self-pay

## 2020-03-02 ENCOUNTER — Ambulatory Visit (INDEPENDENT_AMBULATORY_CARE_PROVIDER_SITE_OTHER): Payer: Medicare Other | Admitting: Acute Care

## 2020-03-02 ENCOUNTER — Encounter: Payer: Self-pay | Admitting: Acute Care

## 2020-03-02 DIAGNOSIS — Z87891 Personal history of nicotine dependence: Secondary | ICD-10-CM

## 2020-03-02 DIAGNOSIS — Z122 Encounter for screening for malignant neoplasm of respiratory organs: Secondary | ICD-10-CM | POA: Insufficient documentation

## 2020-03-02 NOTE — Patient Instructions (Signed)

## 2020-03-02 NOTE — Progress Notes (Addendum)
Shared Decision Making Visit Lung Cancer Screening Program [phone])   Eligibility:  Age 69 y.o.  Pack Years Smoking History Calculation 44 pack years (# packs/per year x # years smoked)  Recent History of coughing up blood  no  Unexplained weight loss? no ( >Than 15 pounds within the last 6 months )  Prior History Lung / other cancer no (Diagnosis within the last 5 years already requiring surveillance chest CT Scans).  Smoking Status Former Smoker  Former Smokers: Years since quit: 3 years  Quit Date: 2018  Visit Components:  Discussion included one or more decision making aids. yes  Discussion included risk/benefits of screening. yes  Discussion included potential follow up diagnostic testing for abnormal scans. yes  Discussion included meaning and risk of over diagnosis. yes  Discussion included meaning and risk of False Positives. yes  Discussion included meaning of total radiation exposure. yes  Counseling Included:  Importance of adherence to annual lung cancer LDCT screening. yes  Impact of comorbidities on ability to participate in the program. yes  Ability and willingness to under diagnostic treatment. yes  Smoking Cessation Counseling:  Current Smokers:   Discussed importance of smoking cessation. yes  Information about tobacco cessation classes and interventions provided to patient. yes  Patient provided with "ticket" for LDCT Scan. yes  Symptomatic Patient. no  Counseling  Diagnosis Code: Tobacco Use Z72.0  Asymptomatic Patient yes  Counseling (Intermediate counseling: > three minutes counseling) L4562  Former Smokers:   Discussed the importance of maintaining cigarette abstinence. yes  Diagnosis Code: Personal History of Nicotine Dependence. B63.893  Information about tobacco cessation classes and interventions provided to patient. Yes  Patient provided with "ticket" for LDCT Scan. yes  Written Order for Lung Cancer Screening with  LDCT placed in Epic. Yes (CT Chest Lung Cancer Screening Low Dose W/O CM) [id number] Z12.2-Screening of respiratory organs Z87.891-Personal history of nicotine dependence  I spent 25 minutes of face to face time with Brianna Nichols  discussing the risks and benefits of lung cancer screening. We viewed a power point together that explained in detail the above noted topics. We took the time to pause the power point at intervals to allow for questions to be asked and answered to ensure understanding. We discussed that she had taken the single most powerful action possible to decrease her risk of developing lung cancer when she quit smoking. I counseled her to remain smoke free, and to contact me if she ever had the desire to smoke again so that I can provide resources and tools to help support the effort to remain smoke free. We discussed the time and location of the scan, and that either  Brianna Miyamoto RN or I will call with the results within  24-48 hours of receiving them. She has my card and contact information in the event she needs to speak with me, in addition to a copy of the power point we reviewed as a resource. She verbalized understanding of all of the above and had no further questions upon leaving the office.     I explained to the patient that there has been a high incidence of coronary artery disease noted on these exams. I explained that this is a non-gated exam therefore degree or severity cannot be determined. This patient is currently on statin therapy. I have asked the patient to follow-up with their PCP regarding any incidental finding of coronary artery disease and management with diet or medication as they feel is clinically indicated.  The patient verbalized understanding of the above and had no further questions.     Bevelyn Ngo, NP 03/02/2020

## 2020-03-03 ENCOUNTER — Other Ambulatory Visit: Payer: Self-pay | Admitting: *Deleted

## 2020-03-03 ENCOUNTER — Ambulatory Visit: Payer: Medicare Other | Admitting: Orthopaedic Surgery

## 2020-03-03 DIAGNOSIS — Z87891 Personal history of nicotine dependence: Secondary | ICD-10-CM

## 2020-03-03 NOTE — Progress Notes (Signed)
Please call patient and let them  know their  low dose Ct was read as a Lung RADS 2: nodules that are benign in appearance and behavior with a very low likelihood of becoming a clinically active cancer due to size or lack of growth. Recommendation per radiology is for a repeat LDCT in 12 months. .Please let them  know we will order and schedule their  annual screening scan for 02/2021. Please let them  know there was notation of CAD on their  scan.  Please remind the patient  that this is a non-gated exam therefore degree or severity of disease  cannot be determined. Please have them  follow up with their PCP regarding potential risk factor modification, dietary therapy or pharmacologic therapy if clinically indicated. Pt.  is  currently on statin therapy. Please place order for annual  screening scan for  02/2021 and fax results to PCP. Thanks so much. 

## 2020-03-05 NOTE — Procedures (Signed)
Morton A. Merlene Laughter, MD     [email or web address]             NOCTURNAL POLYSOMNOGRAPHY   LOCATION: [facility]  Patient Name: Brianna Nichols, Brianna Nichols Date: 02/24/2020 Gender: Female D.O.B: 12/27/1950 Age (years): 68 Referring Provider: Claretta Fraise Height (inches): 74 Interpreting Physician: Phillips Odor MD, ABSM Weight (lbs): 140 RPSGT: Peak, Robert BMI: 23 MRN: [id number] Neck Size: 15.00 CLINICAL INFORMATION Sleep Study Type: Split Night CPAP     Indication for sleep study: N/A     Epworth Sleepiness Score: 4  SLEEP STUDY TECHNIQUE As per the AASM Manual for the Scoring of Sleep and Associated Events v2.3 (April 2016) with a hypopnea requiring 4% desaturations.  The channels recorded and monitored were frontal, central and occipital EEG, electrooculogram (EOG), submentalis EMG (chin), nasal and oral airflow, thoracic and abdominal wall motion, anterior tibialis EMG, snore microphone, electrocardiogram, and pulse oximetry. Continuous positive airway pressure (CPAP) was initiated when the patient met split night criteria and was titrated according to treat sleep-disordered breathing.  MEDICATIONS Medications self-administered by patient taken the night of the study : N/A  Current Outpatient Medications:  .  albuterol (PROAIR HFA) 108 (90 Base) MCG/ACT inhaler, INHALE 2 PUFFS INTO LUNGS EVERY FOUR HOURS AS NEEDED, Disp: 18 g, Rfl: 2 .  alendronate (FOSAMAX) 70 MG tablet, Take 1 tablet (70 mg total) by mouth every 7 (seven) days. Take with a full glass of water on an empty stomach. Do not lay down for at least 2 hours, Disp: 4 tablet, Rfl: 11 .  ALPRAZolam (XANAX) 0.5 MG tablet, Take 1 tablet (0.5 mg total) by mouth as needed for anxiety., Disp: 30 tablet, Rfl: 5 .  blood glucose meter kit and supplies, Dispense based on patient and insurance preference. Use up to four times daily as directed. (FOR ICD-10 E10.9, E11.9)., Disp: 1 each, Rfl: 0 .   Choline Fenofibrate (FENOFIBRIC ACID) 45 MG CPDR, TAKE ONE CAPSULE BY MOUTH DAILY FOR high cholesterol (Needs to be seen before next refill), Disp: 30 capsule, Rfl: 0 .  citalopram (CELEXA) 40 MG tablet, Take 1 tablet (40 mg total) by mouth daily., Disp: 90 tablet, Rfl: 1 .  Cyanocobalamin 1000 MCG/ML LIQD, Chew 1 gummy daily, Disp: , Rfl:  .  diclofenac (VOLTAREN) 75 MG EC tablet, Take 1 tablet (75 mg total) by mouth 2 (two) times daily. For muscle and  Joint pain, Disp: 60 tablet, Rfl: 2 .  omega-3 acid ethyl esters (LOVAZA) 1 g capsule, TAKE 2 CAPSULES BY MOUTH TWICE DAILY, Disp: 360 capsule, Rfl: 0 .  Omega-3 Fatty Acids (FISH OIL) 1000 MG CAPS, Take 2 capsules by mouth 2 (two) times daily., Disp: , Rfl:  .  omeprazole (PRILOSEC) 40 MG capsule, Take by mouth., Disp: , Rfl:  .  simvastatin (ZOCOR) 20 MG tablet, TAKE 1 TABLET BY MOUTH DAILY, Disp: 90 tablet, Rfl: 0  RESPIRATORY PARAMETERS Diagnostic  Total AHI (/hr): 17.0 RDI (/hr): 31.4 OA Index (/hr): 1.5 CA Index (/hr): 0.2 REM AHI (/hr): 49.5 NREM AHI (/hr): 12.9 Supine AHI (/hr): 23.6 Non-supine AHI (/hr): 0 Min O2 Sat (%): 86.0 Mean O2 (%): 92.6 Time below 88% (min): 1.1   Titration  Optimal Pressure (cm): 13 AHI at Optimal Pressure (/hr): 0.0 Min O2 at Optimal Pressure (%): 90.0 Supine % at Optimal (%): 100 Sleep % at Optimal (%): 99    SLEEP ARCHITECTURE The recording time for the entire night was 458.9 minutes.  During a baseline period  of 328.9 minutes, the patient slept for 282.5 minutes in REM and nonREM, yielding a sleep efficiency of 85.9%%. Sleep onset after lights out was 18.1 minutes with a REM latency of 266.0 minutes. The patient spent 11.0%% of the night in stage N1 sleep, 77.9%% in stage N2 sleep, 0.0%% in stage N3 and 11.2% in REM.     During the titration period of 128.0 minutes, the patient slept for 109.0 minutes in REM and nonREM, yielding a sleep efficiency of 85.2%%. Sleep onset after CPAP initiation was 9.1  minutes with a REM latency of 42.0 minutes. The patient spent 9.2%% of the night in stage N1 sleep, 43.6%% in stage N2 sleep, 0.0%% in stage N3 and 47.2% in REM.  CARDIAC DATA The 2 lead EKG demonstrated sinus rhythm. The mean heart rate was 100.0 beats per minute. Other EKG findings include: PVCs.  LEG MOVEMENT DATA The total Periodic Limb Movements of Sleep (PLMS) were 0. The PLMS index was 0.0.  IMPRESSIONS Moderate obstructive sleep apnea occurred during the diagnostic portion of the study(AHI = 17.0/hour). The optimal CPAP selected for this patient is ( 13 cm of water)   Delano Metz, MD Diplomate, American Board of Sleep Medicine.  ELECTRONICALLY SIGNED ON:  03/05/2020, 4:33 PM [place] PH: [phone]   FX: [phone] [place]

## 2020-03-07 ENCOUNTER — Telehealth: Payer: Self-pay | Admitting: *Deleted

## 2020-03-07 NOTE — Telephone Encounter (Signed)
VM - pt calling to see if sleep study has been received to that she can get her machine Please advise

## 2020-03-08 NOTE — Telephone Encounter (Signed)
Doesn't the sleep doctor do that? (report is here.)

## 2020-03-09 NOTE — Telephone Encounter (Signed)
We are responsible for titrations and orders when only a sleep study is ordered and not a referral to a specialist

## 2020-03-09 NOTE — Telephone Encounter (Signed)
She needs to see the specialist. I do not do this.

## 2020-03-10 ENCOUNTER — Other Ambulatory Visit: Payer: Self-pay | Admitting: Family Medicine

## 2020-03-10 ENCOUNTER — Telehealth: Payer: Self-pay | Admitting: Family Medicine

## 2020-03-10 DIAGNOSIS — G4733 Obstructive sleep apnea (adult) (pediatric): Secondary | ICD-10-CM

## 2020-03-10 MED ORDER — OZEMPIC (0.25 OR 0.5 MG/DOSE) 2 MG/1.5ML ~~LOC~~ SOPN: 0.2500 mg | PEN_INJECTOR | SUBCUTANEOUS | 0 refills | Status: DC

## 2020-03-10 NOTE — Telephone Encounter (Signed)
That sounds great.  I sent in the patient's prescription for Ozempic.  She will need to follow-up here in 1 month with me.  Please make appointment right away so we can get her in.  During this month she will need to check her sugar daily fasting and 2 hours after her biggest meal.  It is important that she bring all those readings with her when she comes for her evaluation next month.

## 2020-03-10 NOTE — Telephone Encounter (Signed)
Pt states the pharmacy stated the ozempic will be around $900. Pt is wanting to see if trulicity is an injection?

## 2020-03-10 NOTE — Telephone Encounter (Signed)
Do they do sleep med? Will they accept test from another provider?

## 2020-03-10 NOTE — Telephone Encounter (Signed)
Please review and advise.

## 2020-03-10 NOTE — Telephone Encounter (Signed)
Please make a referral for patient to [facility] to order cpap. She has seen them before

## 2020-03-10 NOTE — Telephone Encounter (Signed)
Pt would like to see if she can try the ozempic shot instead of Metformin.

## 2020-03-10 NOTE — Telephone Encounter (Signed)
Lmtcb.

## 2020-03-10 NOTE — Telephone Encounter (Signed)
Pt called back and states that she will keep the ozempic and will try to get help to afford the medication.

## 2020-03-11 ENCOUNTER — Telehealth: Payer: Self-pay | Admitting: Family Medicine

## 2020-03-11 NOTE — Telephone Encounter (Signed)
°  Prescription Request  03/11/2020  What is the name of the medication or equipment? Cpap Machine  Have you contacted your pharmacy to request a refill? (if applicable) No  Which pharmacy would you like this sent to? [facility]   Patient notified that their request is being sent to the clinical staff for review and that they should receive a response within 2 business days.   Stacks' pt.  She has called several times to check on this & no has called her back.  Please call pt to let her know the status on this.  [employer]

## 2020-03-11 NOTE — Telephone Encounter (Signed)
It is [place] & NOT  [facility].

## 2020-03-14 ENCOUNTER — Telehealth: Payer: Self-pay | Admitting: Family Medicine

## 2020-03-14 NOTE — Telephone Encounter (Signed)
Pt aware referral has been placed to [employer], it is being worked on by their office and they will get in contact with her.

## 2020-03-14 NOTE — Telephone Encounter (Signed)
I wanted Brianna Nichols to see a sleep specialist.  Specialists have ways to fit the masks because they are very hard to get just right.  They also have access to the most recent technology for the CPAP machines.

## 2020-03-14 NOTE — Telephone Encounter (Signed)
Patient aware verbalizes understanding.  

## 2020-03-16 ENCOUNTER — Other Ambulatory Visit: Payer: Self-pay | Admitting: Family Medicine

## 2020-03-16 DIAGNOSIS — G4733 Obstructive sleep apnea (adult) (pediatric): Secondary | ICD-10-CM

## 2020-03-16 NOTE — Telephone Encounter (Signed)
Patient states that her husband is being seen at [facility] now and she will ask his nurse to ask Dr. Darlyn Read nurse.

## 2020-03-16 NOTE — Telephone Encounter (Signed)
Pt states that [facility] states that since she had the sleep study done that she needs the mask and equipment for her CPAP sent to a DME pharmacy. Pt is wanting to speak with a nurse to see what she needs to do or what we can do to get this ordered for her.

## 2020-03-17 ENCOUNTER — Other Ambulatory Visit: Payer: Self-pay

## 2020-03-17 ENCOUNTER — Ambulatory Visit: Payer: Medicare Other | Admitting: Orthopaedic Surgery

## 2020-03-17 ENCOUNTER — Telehealth: Payer: Self-pay | Admitting: Family Medicine

## 2020-03-18 ENCOUNTER — Telehealth: Payer: Self-pay | Admitting: Family Medicine

## 2020-03-18 NOTE — Telephone Encounter (Signed)
RX sent

## 2020-03-18 NOTE — Telephone Encounter (Signed)
Faxed sleep study-

## 2020-03-18 NOTE — Telephone Encounter (Signed)
Patient aware and verbalized understanding. °

## 2020-03-23 DIAGNOSIS — G4733 Obstructive sleep apnea (adult) (pediatric): Secondary | ICD-10-CM | POA: Diagnosis not present

## 2020-04-22 ENCOUNTER — Encounter: Payer: Self-pay | Admitting: *Deleted

## 2020-04-22 DIAGNOSIS — G4733 Obstructive sleep apnea (adult) (pediatric): Secondary | ICD-10-CM | POA: Diagnosis not present

## 2020-04-23 DIAGNOSIS — S93601A Unspecified sprain of right foot, initial encounter: Secondary | ICD-10-CM | POA: Diagnosis not present

## 2020-04-23 DIAGNOSIS — M79604 Pain in right leg: Secondary | ICD-10-CM | POA: Diagnosis not present

## 2020-04-23 DIAGNOSIS — M79671 Pain in right foot: Secondary | ICD-10-CM | POA: Diagnosis not present

## 2020-04-23 DIAGNOSIS — M79605 Pain in left leg: Secondary | ICD-10-CM | POA: Diagnosis not present

## 2020-04-23 DIAGNOSIS — M25571 Pain in right ankle and joints of right foot: Secondary | ICD-10-CM | POA: Diagnosis not present

## 2020-05-10 ENCOUNTER — Encounter: Payer: Self-pay | Admitting: Family Medicine

## 2020-05-10 ENCOUNTER — Ambulatory Visit (INDEPENDENT_AMBULATORY_CARE_PROVIDER_SITE_OTHER): Payer: Medicare Other | Admitting: Family Medicine

## 2020-05-10 ENCOUNTER — Other Ambulatory Visit: Payer: Self-pay

## 2020-05-10 ENCOUNTER — Ambulatory Visit (INDEPENDENT_AMBULATORY_CARE_PROVIDER_SITE_OTHER): Payer: Medicare Other

## 2020-05-10 ENCOUNTER — Other Ambulatory Visit: Payer: Self-pay | Admitting: Family Medicine

## 2020-05-10 VITALS — BP 131/85 | HR 74 | Temp 97.1°F | Ht 65.0 in | Wt 141.6 lb

## 2020-05-10 DIAGNOSIS — M79661 Pain in right lower leg: Secondary | ICD-10-CM | POA: Diagnosis not present

## 2020-05-10 DIAGNOSIS — M79604 Pain in right leg: Secondary | ICD-10-CM | POA: Diagnosis not present

## 2020-05-10 DIAGNOSIS — M79671 Pain in right foot: Secondary | ICD-10-CM | POA: Diagnosis not present

## 2020-05-10 DIAGNOSIS — M25571 Pain in right ankle and joints of right foot: Secondary | ICD-10-CM

## 2020-05-10 DIAGNOSIS — S99921A Unspecified injury of right foot, initial encounter: Secondary | ICD-10-CM | POA: Diagnosis not present

## 2020-05-10 DIAGNOSIS — S99911A Unspecified injury of right ankle, initial encounter: Secondary | ICD-10-CM | POA: Diagnosis not present

## 2020-05-10 NOTE — Progress Notes (Signed)
Chief Complaint  Patient presents with  . Foot Pain  . Ankle Pain  . Leg Pain    HPI  Patient presents today for fell, twisting right foot 2 weeks ago. XR at [facility] was negative, but pain is increasing. Painful to walk on it now.   PMH: Smoking status noted ROS: Per HPI  Objective: BP 131/85   Pulse 74   Temp (!) 97.1 F (36.2 C) (Temporal)   Ht 5\' 5"  (1.651 m)   Wt 141 lb 9.6 oz (64.2 kg)   BMI 23.56 kg/m  Gen: NAD, alert, cooperative with exam Ext: No edema, warm. Tender at right lateral malleolus with moderate edema. No erythema. Pt. Guarding for inversion. NV intact distally. Neuro: Alert and oriented, No gross deficits XR no fracture noted. Preliminary reading done by  Assessment and plan:  1. Right foot pain   2. Acute right ankle pain   3. Pain in right lower leg     Cast boot fitted. WEar it at all times when weight bearing until follow up in about 3 weeks.  Orders Placed This Encounter  Procedures  . DG Foot Complete Right    Standing Status:   Future    Number of Occurrences:   1    Standing Expiration Date:   05/10/2021    Order Specific Question:   Reason for Exam (SYMPTOM  OR DIAGNOSIS REQUIRED)    Answer:   INJURY TO RIGHT FOOT AND ANKLE    Order Specific Question:   Preferred imaging location?    Answer:   Internal    Follow up as needed.  05/12/2021, MD

## 2020-05-19 ENCOUNTER — Ambulatory Visit: Payer: Medicare Other | Admitting: Family Medicine

## 2020-05-23 DIAGNOSIS — G4733 Obstructive sleep apnea (adult) (pediatric): Secondary | ICD-10-CM | POA: Diagnosis not present

## 2020-05-26 ENCOUNTER — Other Ambulatory Visit: Payer: Self-pay | Admitting: Family Medicine

## 2020-05-26 ENCOUNTER — Ambulatory Visit: Payer: Medicare Other | Admitting: Family Medicine

## 2020-05-26 DIAGNOSIS — M25512 Pain in left shoulder: Secondary | ICD-10-CM

## 2020-05-26 DIAGNOSIS — M25511 Pain in right shoulder: Secondary | ICD-10-CM

## 2020-05-31 ENCOUNTER — Other Ambulatory Visit: Payer: Self-pay | Admitting: Family Medicine

## 2020-05-31 ENCOUNTER — Telehealth: Payer: Self-pay

## 2020-05-31 MED ORDER — EMPAGLIFLOZIN 10 MG PO TABS
10.0000 mg | ORAL_TABLET | Freq: Every day | ORAL | 2 refills | Status: DC
Start: 2020-05-31 — End: 2020-07-07

## 2020-05-31 NOTE — Progress Notes (Signed)
Brianna Nichols, she can try it, if she has trouble with prescription and needs assistance with financial income see [name]

## 2020-05-31 NOTE — Telephone Encounter (Signed)
I have not seen this patient.  I'm happy see this patient if appt is scheduled and okay with PCP.  Brianna Nichols is going to be expensive so we can explore options if needed.  Did patient have reaction to metformin?

## 2020-05-31 NOTE — Telephone Encounter (Signed)
Patient states that fasting glucose was 258.  Patient has only had coffee and water.

## 2020-06-01 ENCOUNTER — Other Ambulatory Visit: Payer: Self-pay | Admitting: Family Medicine

## 2020-06-01 MED ORDER — BLOOD GLUCOSE METER KIT: PACK | 0 refills | Status: DC

## 2020-06-02 ENCOUNTER — Other Ambulatory Visit: Payer: Self-pay | Admitting: Family Medicine

## 2020-06-02 NOTE — Telephone Encounter (Signed)
See if she would lkike appointment with Raynelle Fanning to help with med issues.

## 2020-06-02 NOTE — Telephone Encounter (Signed)
Yes I can assist.  Have patient make appt with PharmD  Thanks!

## 2020-06-02 NOTE — Telephone Encounter (Signed)
Patient aware and states she would like for me to ask Raynelle Fanning if she can get assistance if her and her husband make $3000 a month? Please advise and send back to pools.

## 2020-06-03 NOTE — Telephone Encounter (Signed)
Patient states she calculated wrong and they make over 4000 a month.  Wants to know what the income cut off is for help.  Please review and advise

## 2020-06-03 NOTE — Telephone Encounter (Signed)
Please advise what patient needs to bring to the appointment.

## 2020-06-03 NOTE — Telephone Encounter (Signed)
Please have patient bring most recent patient AND spouse income information We will sign forms here

## 2020-06-06 NOTE — Telephone Encounter (Signed)
Income limit is $69,680 for household of 2 ppl

## 2020-06-08 DIAGNOSIS — Z0000 Encounter for general adult medical examination without abnormal findings: Secondary | ICD-10-CM | POA: Diagnosis not present

## 2020-06-08 DIAGNOSIS — E119 Type 2 diabetes mellitus without complications: Secondary | ICD-10-CM | POA: Diagnosis not present

## 2020-06-08 DIAGNOSIS — E782 Mixed hyperlipidemia: Secondary | ICD-10-CM | POA: Diagnosis not present

## 2020-06-08 DIAGNOSIS — G4733 Obstructive sleep apnea (adult) (pediatric): Secondary | ICD-10-CM | POA: Diagnosis not present

## 2020-06-09 ENCOUNTER — Ambulatory Visit: Payer: Medicare Other | Admitting: Pharmacist

## 2020-06-14 DIAGNOSIS — R0981 Nasal congestion: Secondary | ICD-10-CM | POA: Diagnosis not present

## 2020-06-14 DIAGNOSIS — Z6824 Body mass index (BMI) 24.0-24.9, adult: Secondary | ICD-10-CM | POA: Diagnosis not present

## 2020-06-14 DIAGNOSIS — R509 Fever, unspecified: Secondary | ICD-10-CM | POA: Diagnosis not present

## 2020-06-14 DIAGNOSIS — J029 Acute pharyngitis, unspecified: Secondary | ICD-10-CM | POA: Diagnosis not present

## 2020-06-14 DIAGNOSIS — J329 Chronic sinusitis, unspecified: Secondary | ICD-10-CM | POA: Diagnosis not present

## 2020-06-20 DIAGNOSIS — I951 Orthostatic hypotension: Secondary | ICD-10-CM | POA: Diagnosis not present

## 2020-06-20 DIAGNOSIS — R55 Syncope and collapse: Secondary | ICD-10-CM | POA: Diagnosis not present

## 2020-06-27 ENCOUNTER — Other Ambulatory Visit: Payer: Self-pay | Admitting: Family Medicine

## 2020-07-07 ENCOUNTER — Other Ambulatory Visit: Payer: Self-pay

## 2020-07-07 ENCOUNTER — Ambulatory Visit: Payer: Medicare Other | Admitting: Cardiology

## 2020-07-07 ENCOUNTER — Encounter: Payer: Self-pay | Admitting: Cardiology

## 2020-07-07 VITALS — BP 144/76 | HR 73 | Resp 16 | Ht 65.0 in | Wt 147.6 lb

## 2020-07-07 DIAGNOSIS — Z87891 Personal history of nicotine dependence: Secondary | ICD-10-CM

## 2020-07-07 DIAGNOSIS — I2584 Coronary atherosclerosis due to calcified coronary lesion: Secondary | ICD-10-CM

## 2020-07-07 DIAGNOSIS — I2510 Atherosclerotic heart disease of native coronary artery without angina pectoris: Secondary | ICD-10-CM

## 2020-07-07 DIAGNOSIS — E782 Mixed hyperlipidemia: Secondary | ICD-10-CM

## 2020-07-07 DIAGNOSIS — I700 Atherosclerosis of aorta: Secondary | ICD-10-CM

## 2020-07-07 DIAGNOSIS — Z8673 Personal history of transient ischemic attack (TIA), and cerebral infarction without residual deficits: Secondary | ICD-10-CM

## 2020-07-07 DIAGNOSIS — R7303 Prediabetes: Secondary | ICD-10-CM

## 2020-07-07 DIAGNOSIS — G4733 Obstructive sleep apnea (adult) (pediatric): Secondary | ICD-10-CM

## 2020-07-07 DIAGNOSIS — R42 Dizziness and giddiness: Secondary | ICD-10-CM

## 2020-07-07 DIAGNOSIS — Z8249 Family history of ischemic heart disease and other diseases of the circulatory system: Secondary | ICD-10-CM

## 2020-07-07 DIAGNOSIS — R55 Syncope and collapse: Secondary | ICD-10-CM

## 2020-07-07 MED ORDER — ASPIRIN EC 81 MG PO TBEC: 81.0000 mg | DELAYED_RELEASE_TABLET | Freq: Every day | ORAL | 3 refills | Status: DC

## 2020-07-07 NOTE — Progress Notes (Signed)
Date:  07/07/2020   ID:  Brianna Nichols, DOB April 27, 1951, MRN [id number]  PCP:  Brianna Gaskins, FNP  Cardiologist:  Rex Kras, DO, [facility] (established care 07/07/2020) Former Cardiology Providers: NA  REASON FOR CONSULT: Syncope  REQUESTING PHYSICIAN:  Brianna Nichols, [address] [place],  [place] [address]  Chief Complaint  Patient presents with  . New Patient (Initial Visit)    Nearly passing out    HPI  Brianna Nichols is a 69 y.o. female who presents to the office with a chief complaint of " nearly passing out." Patient's past medical history and cardiovascular risk factors include: Family history of premature coronary artery disease, aortic atherosclerosis, coronary atherosclerosis due to calcified coronary lesion for nongated CT study, history of TIA, mixed hyperlipidemia, prediabetes, OSA on CPAP, postmenopausal female, advanced age.   She is referred to the office at the request of Brianna Nichols, * for evaluation of syncope.  Patient states that over Thanksgiving weekend she was at her daughter's place and she ambulated to her kitchen well without any issues.  But while she was standing in the kitchen she felt dizzy and slid down the cabinets and laid on the floor for approximately 20 minutes.  Patient does not recall losing complete consciousness as she could hear conversations with background.  She members being flushed all over from her torso to the neck.  She broke out in a cold sweat, she did not sustain any injuries.  Patient does not lose control of her bowel or bladder function.  Patient states that she felt under the weather the next 2 days experiencing tiredness and fatigue.  She was back to baseline after the two days.  She discussed her symptoms with her primary care provider and was referred to cardiology for further evaluation and management.  No recurrence of near syncope or syncope.  She denies any anginal chest pain or heart failure  symptoms.  She continues to have lightheaded and dizziness at times.  Predominantly when turning the head side to side and doing overhead activities.  She denies the symptoms with change in position.  Patient remembers having history of TIA and was told that she has carotid artery atherosclerosis but has not followed up in the recent past.  Family history of premature coronary disease: Brother had MI at age of 8.    FUNCTIONAL STATUS: She exercises everyday 30 minutes (glider, walking, step climber).    ALLERGIES: Allergies  Allergen Reactions  . Gabapentin Anaphylaxis  . Morphine Other (See Comments)    Felt like body was folding over per pt Out-of-body experience   . Nalbuphine Other (See Comments)    Felt like body was folding over per pt Out-of-body experience   . Penicillins Anaphylaxis    anaphylaxis anaphylaxis   . Bee Pollen Other (See Comments)    Stuffy nose Stuffy nose  . Diphenhydramine Hcl Other (See Comments)    Chest tightness  . Diphenhydramine Hcl (Sleep) Other (See Comments)    Other reaction(s): Other (See Comments) "chest tightness" "chest tightness"   . Hydrocodone-Acetaminophen     Other reaction(s): Other (See Comments) Ended up in ICU; Dilaudid okay.  . Levofloxacin     Other reaction(s): Muscle Pain, Myalgias (intolerance) Muscular problems Muscular problems   . Pollen Extract Other (See Comments)    Stuffy nose Stuffy nose   . Metronidazole Itching    Facial swelling Facial swelling   . Moxifloxacin Nausea Only and Other (  See Comments)    Lightheaded per patient Lightheaded per patient   . Sulfa Antibiotics Rash    rash rash  . Sulfamethoxazole Rash  . Tape Rash    rash rash     MEDICATION LIST PRIOR TO VISIT: Current Meds  Medication Sig  . albuterol (PROAIR HFA) 108 (90 Base) MCG/ACT inhaler INHALE 2 PUFFS INTO LUNGS EVERY FOUR HOURS AS NEEDED  . alendronate (FOSAMAX) 70 MG tablet Take 1 tablet (70 mg total) by mouth  every 7 (seven) days. Take with a full glass of water on an empty stomach. Do not lay down for at least 2 hours  . ALPRAZolam (XANAX) 0.5 MG tablet Take 1 tablet (0.5 mg total) by mouth as needed for anxiety.  . blood glucose meter kit and supplies Dispense based on patient and insurance preference. Use up to four times daily as directed. (FOR ICD-10 E10.9, E11.9).  Marland Kitchen Choline Fenofibrate (FENOFIBRIC ACID) 45 MG CPDR TAKE ONE CAPSULE BY MOUTH DAILY FOR high cholesterol (Needs to be seen before next refill)  . citalopram (CELEXA) 40 MG tablet Take 1 tablet (40 mg total) by mouth daily.  . Cyanocobalamin 1000 MCG/ML LIQD Chew 1 gummy daily  . diclofenac (VOLTAREN) 75 MG EC tablet TAKE 1 TABLET(75 MG) BY MOUTH MUSCLE TWICE DAILY FOR JOINT PAIN  . glucose blood (ONETOUCH VERIO) test strip USE FOUR TIMES DAILY Dx R73.03  . omega-3 acid ethyl esters (LOVAZA) 1 g capsule TAKE 2 CAPSULES BY MOUTH TWICE DAILY  . Omega-3 Fatty Acids (FISH OIL) 1000 MG CAPS Take 2 capsules by mouth 2 (two) times daily.  Marland Kitchen omeprazole (PRILOSEC) 40 MG capsule Take by mouth.  . simvastatin (ZOCOR) 20 MG tablet TAKE 1 TABLET BY MOUTH DAILY     PAST MEDICAL HISTORY: Past Medical History:  Diagnosis Date  . Asthma   . Cataract   . Diverticulitis   . Eczema   . Hyperlipidemia   . Obstructive sleep apnea   . Pre-diabetes   . Psoriasis     PAST SURGICAL HISTORY: Past Surgical History:  Procedure Laterality Date  . ABDOMINAL HYSTERECTOMY    . CHOLECYSTECTOMY    . COLON SURGERY    . EYE SURGERY    . HYSTEROTOMY    . LARYNX SURGERY      FAMILY HISTORY: The patient family history includes Cancer in her mother and sister; Coronary artery disease in her father, sister, sister, sister, and sister; Diabetes in her daughter; Heart attack in her brother and father.  SOCIAL HISTORY:  The patient  reports that she quit smoking about 3 years ago. Her smoking use included cigarettes. She has a 30.00 pack-year smoking history.  She has never used smokeless tobacco. She reports that she does not drink alcohol and does not use drugs.  REVIEW OF SYSTEMS: Review of Systems  Constitutional: Negative for chills and fever.  HENT: Negative for hoarse voice and nosebleeds.   Eyes: Negative for discharge, double vision and pain.  Cardiovascular: Positive for near-syncope (last episode thanksgiving weekend). Negative for chest pain, claudication, dyspnea on exertion, leg swelling, orthopnea, palpitations, paroxysmal nocturnal dyspnea and syncope.  Respiratory: Negative for hemoptysis and shortness of breath.   Musculoskeletal: Negative for muscle cramps and myalgias.  Gastrointestinal: Negative for abdominal pain, constipation, diarrhea, hematemesis, hematochezia, melena, nausea and vomiting.  Neurological: Positive for dizziness and light-headedness.    PHYSICAL EXAM: Vitals with BMI 07/07/2020 05/10/2020 02/17/2020  Height _0  _1  _2   Weight 147 lbs 10  oz 141 lbs 10 oz 139 lbs 10 oz  BMI 24.56 78.29 56.21  Systolic 308 657 846  Diastolic 76 85 78  Pulse 73 74 78   Orthostatic VS for the past 72 hrs (Last 3 readings):  Orthostatic BP Patient Position BP Location Cuff Size Orthostatic Pulse  07/07/20 1050 127/89 Standing Left Arm Normal 74  07/07/20 1049 122/73 Sitting Left Arm Normal 73  07/07/20 1048 140/77 Supine Left Arm Normal 68   CONSTITUTIONAL: Well-developed and well-nourished. No acute distress.  SKIN: Skin is warm and dry. No rash noted. No cyanosis. No pallor. No jaundice HEAD: Normocephalic and atraumatic.  EYES: No scleral icterus MOUTH/THROAT: Moist oral membranes.  NECK: No JVD present. No thyromegaly noted. No carotid bruits  LYMPHATIC: No visible cervical adenopathy.  CHEST Normal respiratory effort. No intercostal retractions  LUNGS: Clear to auscultation bilaterally. No stridor. No wheezes. No rales.  CARDIOVASCULAR: Regular rate and rhythm, positive S1-S2, no murmurs rubs or gallops  appreciated. ABDOMINAL: No apparent ascites.  EXTREMITIES: No peripheral edema  HEMATOLOGIC: No significant bruising NEUROLOGIC: Oriented to person, place, and time. Nonfocal. Normal muscle tone.  PSYCHIATRIC: Normal mood and affect. Normal behavior. Cooperative  RADIOLOGY:  03/02/2020 CT chest lung cancer screening: Aortic atherosclerosis. Emphysema. Three-vessel coronary artery atherosclerosis  CARDIAC DATABASE: EKG: 07/07/2020: Normal sinus rhythm, 67 bpm, normal axis, T wave inversions in the high lateral leads and anteroseptal leads cannot rule out ischemia, without underlying injury pattern.  Echocardiogram: NA   Stress Testing: 4 years ago at outside facility.   Heart Catheterization: None  LABORATORY DATA: CBC Latest Ref Rng & Units 02/17/2020 11/17/2019 12/30/2018  WBC 3.4 - 10.8 x10E3/uL 6.8 7.0 5.8  Hemoglobin 11.1 - 15.9 g/dL 15.3 16.1(H) 14.5  Hematocrit 34.0 - 46.6 % 46.0 47.3(H) 44.0  Platelets 150 - 450 x10E3/uL 337 285 316    CMP Latest Ref Rng & Units 02/17/2020 11/17/2019 12/30/2018  Glucose 65 - 99 mg/dL 134(H) 112(H) 123(H)  BUN 8 - 27 mg/dL _0 Creatinine 0.57 - 1.00 mg/dL 0.78 0.78 0.63  Sodium 134 - 144 mmol/L 140 139 140  Potassium 3.5 - 5.2 mmol/L 4.8 5.2 4.6  Chloride 96 - 106 mmol/L 100 103 106  CO2 20 - 29 mmol/L _1 Calcium 8.7 - 10.3 mg/dL 9.5 9.4 9.4  Total Protein 6.0 - 8.5 g/dL 7.1 7.0 -  Total Bilirubin 0.0 - 1.2 mg/dL 0.3 0.3 -  Alkaline Phos 48 - 121 IU/L 73 95 -  AST 0 - 40 IU/L 13 21 -  ALT 0 - 32 IU/L 15 18 -    Lipid Panel  Lab Results  Component Value Date   CHOL 286 (H) 11/17/2019   HDL 39 (L) 11/17/2019   LDLCALC 181 (H) 11/17/2019   TRIG 337 (H) 11/17/2019   CHOLHDL 7.3 (H) 11/17/2019    No components found for: NTPROBNP No results for input(s): PROBNP in the last 8760 hours. Recent Labs    11/17/19 1106  TSH 0.994    BMP Recent Labs    11/17/19 1106 02/17/20 0927  NA 139 140  K 5.2 4.8  CL 103  100  CO2 24 25  GLUCOSE 112* 134*  BUN 12 11  CREATININE 0.78 0.78  CALCIUM 9.4 9.5  GFRNONAA 78 78  GFRAA 90 90    HEMOGLOBIN A1C Lab Results  Component Value Date   HGBA1C 7.1 (H) 02/17/2020   External Labs:  Date Collected: 06/08/2020 Creatinine 0.8 mg/dL.  eGFR: 76 mL/min per 1.73 m Lipid profile: Total cholesterol 256, triglycerides 321, HDL 43, LDL 164, non HDL 213.  Hemoglobin A1c: 6.4  IMPRESSION:    ICD-10-CM   1. Near syncope  R55 EKG 12-Lead  2. Lightheadedness  R42 PCV CAROTID DUPLEX (BILATERAL)  3. Family history of premature CAD  Z82.49 PCV ECHOCARDIOGRAM COMPLETE    PCV MYOCARDIAL PERFUSION WO LEXISCAN  4. Atherosclerosis of aorta (HCC)  I70.0 SARS-COV-2 RNA,(COVID-19) QUAL NAAT  5. Coronary atherosclerosis due to calcified coronary lesion of native artery  I25.10 aspirin EC 81 MG tablet   I25.84 PCV ECHOCARDIOGRAM COMPLETE    PCV MYOCARDIAL PERFUSION WO LEXISCAN    SARS-COV-2 RNA,(COVID-19) QUAL NAAT  6. Hx-TIA (transient ischemic attack)  Z86.73 aspirin EC 81 MG tablet    PCV ECHOCARDIOGRAM COMPLETE    PCV MYOCARDIAL PERFUSION WO LEXISCAN    SARS-COV-2 RNA,(COVID-19) QUAL NAAT  7. Former smoker  Z87.891   21. Mixed hyperlipidemia  E78.2 PCV ECHOCARDIOGRAM COMPLETE    PCV MYOCARDIAL PERFUSION WO LEXISCAN  9. Prediabetes  R73.03 PCV ECHOCARDIOGRAM COMPLETE    PCV MYOCARDIAL PERFUSION WO LEXISCAN  10. OSA on CPAP  G47.33    Z99.89      RECOMMENDATIONS: MIKI LABUDA is a 68 y.o. female whose past medical history and cardiac risk factors include: Family history of premature coronary artery disease, aortic atherosclerosis, coronary atherosclerosis due to calcified coronary lesion for nongated CT study, history of TIA, mixed hyperlipidemia, prediabetes, OSA on CPAP, postmenopausal female, advanced age.  Near-syncope:  Patient symptoms are most likely secondary to vasovagal etiology.  Orthostatic vital signs negative.  No history of malignant  arrhythmias.  Echocardiogram will be ordered to evaluate for structural heart disease and left ventricular systolic function.  If the symptoms resurface we will consider doing a Holter monitor to evaluate for underlying dysrhythmias.  Patient agreeable with the plan of care.  Coronary atherosclerosis noted on nongated CT study:  Patient has at least 40 year pack history of smoking and undergoes lung cancer screening CT periodically.  Most recent study from August 2021 noted three-vessel coronary artery atherosclerosis, aortic atherosclerosis as well.  Continue statin therapy.  Start aspirin 81 mg p.o. daily  Most recent lipid profile independently reviewed from outside facility as a part of this consultation noted above.  Patient's total cholesterol is greater than 271m/dL, triglycerides greater than 150 mg/dL and LDL is 164 mg/dL.  Patient's cholesterol is currently being managed by PCP.  Discussed with the patient consider up titration of statin therapy see if high-intensity statin and to reevaluate the lipid profile.  If needed may consider the addition of PCSK9 inhibitors.  For now will defer management to PCP until requested to intervene.  Patient agreeable with the plan.  Given her history of near syncope in the setting of multiple cardiovascular risk factors including prediabetes, uncontrolled hyperlipidemia, coronary atherosclerosis noted on nongated CT study, aortic atherosclerosis, extensive smoking history, EKG findings recommended exercise nuclear stress test.  Lightheaded and dizziness:  Patient states that she has a history of carotid artery atherosclerosis.  And given her symptoms would recommend a repeat carotid duplex to reevaluate disease process.  She may also have a component of BPPV as the symptoms are brought on by turning her head briskly from side to side.  Defer further management to PCP.  Further recommendations to follow.  Mixed hyperlipidemia: See  above  Former smoker: Educated on the importance of continued smoking cessation.  History of TIA: Continue aspirin  and statin therapy.  Educated on the importance of secondary prevention.   FINAL MEDICATION LIST END OF ENCOUNTER: Meds ordered this encounter  Medications  . aspirin EC 81 MG tablet    Sig: Take 1 tablet (81 mg total) by mouth daily. Swallow whole.    Dispense:  90 tablet    Refill:  3    Medications Discontinued During This Encounter  Medication Reason  . empagliflozin (JARDIANCE) 10 MG TABS tablet Patient Preference  . Semaglutide,0.25 or 0.5MG/DOS, (OZEMPIC, 0.25 OR 0.5 MG/DOSE,) 2 MG/1.5ML SOPN Patient Preference     Current Outpatient Medications:  .  albuterol (PROAIR HFA) 108 (90 Base) MCG/ACT inhaler, INHALE 2 PUFFS INTO LUNGS EVERY FOUR HOURS AS NEEDED, Disp: 18 g, Rfl: 2 .  alendronate (FOSAMAX) 70 MG tablet, Take 1 tablet (70 mg total) by mouth every 7 (seven) days. Take with a full glass of water on an empty stomach. Do not lay down for at least 2 hours, Disp: 4 tablet, Rfl: 11 .  ALPRAZolam (XANAX) 0.5 MG tablet, Take 1 tablet (0.5 mg total) by mouth as needed for anxiety., Disp: 30 tablet, Rfl: 5 .  blood glucose meter kit and supplies, Dispense based on patient and insurance preference. Use up to four times daily as directed. (FOR ICD-10 E10.9, E11.9)., Disp: 1 each, Rfl: 0 .  Choline Fenofibrate (FENOFIBRIC ACID) 45 MG CPDR, TAKE ONE CAPSULE BY MOUTH DAILY FOR high cholesterol (Needs to be seen before next refill), Disp: 30 capsule, Rfl: 0 .  citalopram (CELEXA) 40 MG tablet, Take 1 tablet (40 mg total) by mouth daily., Disp: 90 tablet, Rfl: 1 .  Cyanocobalamin 1000 MCG/ML LIQD, Chew 1 gummy daily, Disp: , Rfl:  .  diclofenac (VOLTAREN) 75 MG EC tablet, TAKE 1 TABLET(75 MG) BY MOUTH MUSCLE TWICE DAILY FOR JOINT PAIN, Disp: 60 tablet, Rfl: 2 .  glucose blood (ONETOUCH VERIO) test strip, USE FOUR TIMES DAILY Dx R73.03, Disp: 400 strip, Rfl: 4 .  omega-3 acid  ethyl esters (LOVAZA) 1 g capsule, TAKE 2 CAPSULES BY MOUTH TWICE DAILY, Disp: 360 capsule, Rfl: 0 .  Omega-3 Fatty Acids (FISH OIL) 1000 MG CAPS, Take 2 capsules by mouth 2 (two) times daily., Disp: , Rfl:  .  omeprazole (PRILOSEC) 40 MG capsule, Take by mouth., Disp: , Rfl:  .  simvastatin (ZOCOR) 20 MG tablet, TAKE 1 TABLET BY MOUTH DAILY, Disp: 90 tablet, Rfl: 0 .  aspirin EC 81 MG tablet, Take 1 tablet (81 mg total) by mouth daily. Swallow whole., Disp: 90 tablet, Rfl: 3  Orders Placed This Encounter  Procedures  . SARS-COV-2 RNA,(COVID-19) QUAL NAAT  . PCV MYOCARDIAL PERFUSION WO LEXISCAN  . EKG 12-Lead  . PCV ECHOCARDIOGRAM COMPLETE  . PCV CAROTID DUPLEX (BILATERAL)    There are no Patient Instructions on file for this visit.   --Continue cardiac medications as reconciled in final medication list. --Return in about 4 weeks (around 08/04/2020) for Follow up reevaluate symptoms, review test results. Or sooner if needed. --Continue follow-up with your primary care physician regarding the management of your other chronic comorbid conditions.  Patient's questions and concerns were addressed to her satisfaction. She voices understanding of the instructions provided during this encounter.   This note was created using a voice recognition software as a result there may be grammatical errors inadvertently enclosed that do not reflect the nature of this encounter. Every attempt is made to correct such errors.  Total time spent: 67 minutes.  Independently obtained  HPI, review of outside records, independently reviewed laboratory results, reviewed last stress test 2007, discussed disease management, formulating plan of care, coordination of care.  Rex Kras, [place], [facility]  Pager: [phone] Office: [phone]

## 2020-07-14 ENCOUNTER — Other Ambulatory Visit (HOSPITAL_COMMUNITY)
Admission: RE | Admit: 2020-07-14 | Discharge: 2020-07-14 | Disposition: A | Discharge status: Home / Self Care | Payer: Medicare Other | Source: Ambulatory Visit | Attending: Cardiology | Admitting: Cardiology

## 2020-07-14 DIAGNOSIS — Z01812 Encounter for preprocedural laboratory examination: Secondary | ICD-10-CM | POA: Insufficient documentation

## 2020-07-14 DIAGNOSIS — Z20822 Contact with and (suspected) exposure to covid-19: Secondary | ICD-10-CM | POA: Insufficient documentation

## 2020-07-14 LAB — SARS CORONAVIRUS 2 (TAT 6-24 HRS): SARS Coronavirus 2: NEGATIVE

## 2020-07-18 ENCOUNTER — Ambulatory Visit: Payer: Medicare Other

## 2020-07-18 ENCOUNTER — Other Ambulatory Visit: Payer: Self-pay

## 2020-07-18 DIAGNOSIS — Z8673 Personal history of transient ischemic attack (TIA), and cerebral infarction without residual deficits: Secondary | ICD-10-CM

## 2020-07-18 DIAGNOSIS — Z8249 Family history of ischemic heart disease and other diseases of the circulatory system: Secondary | ICD-10-CM

## 2020-07-18 DIAGNOSIS — I2510 Atherosclerotic heart disease of native coronary artery without angina pectoris: Secondary | ICD-10-CM

## 2020-07-18 DIAGNOSIS — E782 Mixed hyperlipidemia: Secondary | ICD-10-CM

## 2020-07-18 DIAGNOSIS — R7303 Prediabetes: Secondary | ICD-10-CM

## 2020-07-28 ENCOUNTER — Other Ambulatory Visit: Payer: Medicare Other

## 2020-07-28 ENCOUNTER — Other Ambulatory Visit: Payer: Self-pay | Admitting: Family Medicine

## 2020-08-03 ENCOUNTER — Ambulatory Visit: Payer: Medicare Other

## 2020-08-03 ENCOUNTER — Other Ambulatory Visit: Payer: Self-pay

## 2020-08-03 DIAGNOSIS — R7303 Prediabetes: Secondary | ICD-10-CM

## 2020-08-03 DIAGNOSIS — Z8673 Personal history of transient ischemic attack (TIA), and cerebral infarction without residual deficits: Secondary | ICD-10-CM

## 2020-08-03 DIAGNOSIS — I2510 Atherosclerotic heart disease of native coronary artery without angina pectoris: Secondary | ICD-10-CM

## 2020-08-03 DIAGNOSIS — E782 Mixed hyperlipidemia: Secondary | ICD-10-CM

## 2020-08-03 DIAGNOSIS — Z8249 Family history of ischemic heart disease and other diseases of the circulatory system: Secondary | ICD-10-CM

## 2020-08-03 DIAGNOSIS — R42 Dizziness and giddiness: Secondary | ICD-10-CM

## 2020-08-05 ENCOUNTER — Encounter: Payer: Self-pay | Admitting: Cardiology

## 2020-08-05 ENCOUNTER — Other Ambulatory Visit: Payer: Self-pay

## 2020-08-05 ENCOUNTER — Ambulatory Visit: Payer: Medicare Other | Admitting: Cardiology

## 2020-08-05 VITALS — BP 117/73 | HR 80 | Ht 65.0 in | Wt 146.0 lb

## 2020-08-05 DIAGNOSIS — Z87891 Personal history of nicotine dependence: Secondary | ICD-10-CM

## 2020-08-05 DIAGNOSIS — I2584 Coronary atherosclerosis due to calcified coronary lesion: Secondary | ICD-10-CM

## 2020-08-05 DIAGNOSIS — Z8673 Personal history of transient ischemic attack (TIA), and cerebral infarction without residual deficits: Secondary | ICD-10-CM

## 2020-08-05 DIAGNOSIS — I700 Atherosclerosis of aorta: Secondary | ICD-10-CM

## 2020-08-05 DIAGNOSIS — Z8249 Family history of ischemic heart disease and other diseases of the circulatory system: Secondary | ICD-10-CM

## 2020-08-05 DIAGNOSIS — Z9989 Dependence on other enabling machines and devices: Secondary | ICD-10-CM

## 2020-08-05 DIAGNOSIS — G4733 Obstructive sleep apnea (adult) (pediatric): Secondary | ICD-10-CM

## 2020-08-05 DIAGNOSIS — R7303 Prediabetes: Secondary | ICD-10-CM

## 2020-08-05 DIAGNOSIS — I2510 Atherosclerotic heart disease of native coronary artery without angina pectoris: Secondary | ICD-10-CM

## 2020-08-05 DIAGNOSIS — R42 Dizziness and giddiness: Secondary | ICD-10-CM

## 2020-08-05 DIAGNOSIS — E782 Mixed hyperlipidemia: Secondary | ICD-10-CM

## 2020-08-05 DIAGNOSIS — Z712 Person consulting for explanation of examination or test findings: Secondary | ICD-10-CM

## 2020-08-05 NOTE — Progress Notes (Signed)
Date:  08/05/2020   ID:  Brianna Nichols, DOB [birth year], MRN [id number]  PCP:  Brianna Gaskins, FNP  Cardiologist:  Brianna Kras, DO, [facility] (established care 07/07/2020) Former Cardiology Providers: NA  Date: 08/05/20 Last Office Visit: 07/07/2020  Chief Complaint  Patient presents with  . Near Syncope  . Follow-up    HPI  Brianna Nichols is a 70 y.o. female who presents to the office with a chief complaint of " reevaluation of near syncope and review test results." Patient's past medical history and cardiovascular risk factors include: Family history of premature coronary artery disease, aortic atherosclerosis, coronary atherosclerosis due to calcified coronary lesion for nongated CT study, history of TIA, mixed hyperlipidemia, prediabetes, OSA on CPAP, postmenopausal female, advanced age.   She is referred to the office at the request of Brianna Nichols, * for evaluation of syncope.  Patient is accompanied by her husband Brianna Nichols at today's visit.  Patient was referred to the office after having a vasovagal syncope during the Thanksgiving weekend while she was at her daughter's place.  Since last visit she has not had any recurrence of symptoms.  At the last office visit she was also noted to have coronary artery calcification and on gated CT study that she has for routine lung cancer screening given her history of smoking.  She was recommended to start aspirin and statin therapy.  She is tolerating the medications well without any side effects or intolerances.  She also underwent an ischemic evaluation including an echo and stress test.  Results was reviewed with both her and her husband at today's visit.  In addition, given her history of TIA, episodes of vasovagal syncope, and continued lightheaded and dizziness recommended undergoing carotid duplex to rule out carotid artery stenosis.  Images reviewed final report pending but overall patient is noted to have bilateral carotid  artery atherosclerosis without any significant stenosis.  She was also recommended to follow-up with ENT to rule out BPPV; however, patient failed to do so.  Family history of premature coronary disease: Brother had MI at age of 60.   FUNCTIONAL STATUS: She exercises everyday 30 minutes (glider, walking, step climber).    ALLERGIES: Allergies  Allergen Reactions  . Gabapentin Anaphylaxis  . Morphine Other (See Comments)    Felt like body was folding over per pt Out-of-body experience   . Nalbuphine Other (See Comments)    Felt like body was folding over per pt Out-of-body experience   . Penicillins Anaphylaxis    anaphylaxis anaphylaxis   . Bee Pollen Other (See Comments)    Stuffy nose Stuffy nose  . Diphenhydramine Hcl Other (See Comments)    Chest tightness  . Diphenhydramine Hcl (Sleep) Other (See Comments)    Other reaction(s): Other (See Comments) "chest tightness" "chest tightness"   . Hydrocodone-Acetaminophen     Other reaction(s): Other (See Comments) Ended up in ICU; Dilaudid okay.  . Levofloxacin     Other reaction(s): Muscle Pain, Myalgias (intolerance) Muscular problems Muscular problems   . Pollen Extract Other (See Comments)    Stuffy nose Stuffy nose   . Shellfish Allergy Itching    Other reaction(s): Other (See Comments) "chest tightness" "chest tightness"  . Metronidazole Itching    Facial swelling Facial swelling   . Moxifloxacin Nausea Only and Other (See Comments)    Lightheaded per patient Lightheaded per patient   . Pedi-Pre Tape Spray [Wound Dressing Adhesive] Rash    rash rash  . Sulfa Antibiotics  Rash    rash rash rash rash  . Sulfamethoxazole Rash  . Tape Rash    rash rash     MEDICATION LIST PRIOR TO VISIT: Current Meds  Medication Sig  . albuterol (PROAIR HFA) 108 (90 Base) MCG/ACT inhaler INHALE 2 PUFFS INTO LUNGS EVERY FOUR HOURS AS NEEDED  . alendronate (FOSAMAX) 70 MG tablet Take 1 tablet (70 mg total) by  mouth every 7 (seven) days. Take with a full glass of water on an empty stomach. Do not lay down for at least 2 hours  . ALPRAZolam (XANAX) 0.5 MG tablet Take 1 tablet (0.5 mg total) by mouth as needed for anxiety.  Marland Kitchen aspirin EC 81 MG tablet Take 1 tablet (81 mg total) by mouth daily. Swallow whole.  . blood glucose meter kit and supplies Dispense based on patient and insurance preference. Use up to four times daily as directed. (FOR ICD-10 E10.9, E11.9).  Marland Kitchen Choline Fenofibrate (FENOFIBRIC ACID) 45 MG CPDR TAKE ONE CAPSULE BY MOUTH DAILY FOR high cholesterol (Needs to be seen before next refill)  . citalopram (CELEXA) 40 MG tablet Take 1 tablet (40 mg total) by mouth daily.  . Cyanocobalamin 1000 MCG/ML LIQD Chew 1 gummy daily  . diclofenac (VOLTAREN) 75 MG EC tablet TAKE 1 TABLET(75 MG) BY MOUTH MUSCLE TWICE DAILY FOR JOINT PAIN  . glucose blood (ONETOUCH VERIO) test strip USE FOUR TIMES DAILY Dx R73.03  . Omega-3 Fatty Acids (FISH OIL) 1000 MG CAPS Take 2 capsules by mouth 2 (two) times daily.  Marland Kitchen omeprazole (PRILOSEC) 40 MG capsule Take 40 mg by mouth as needed.  . simvastatin (ZOCOR) 40 MG tablet Take 40 mg by mouth at bedtime.  . [DISCONTINUED] omega-3 acid ethyl esters (LOVAZA) 1 g capsule TAKE 2 CAPSULES BY MOUTH TWICE DAILY  . [DISCONTINUED] simvastatin (ZOCOR) 20 MG tablet TAKE 1 TABLET BY MOUTH DAILY     PAST MEDICAL HISTORY: Past Medical History:  Diagnosis Date  . Asthma   . Cataract   . Diverticulitis   . Eczema   . Hyperlipidemia   . Obstructive sleep apnea   . Pre-diabetes   . Psoriasis     PAST SURGICAL HISTORY: Past Surgical History:  Procedure Laterality Date  . ABDOMINAL HYSTERECTOMY    . CHOLECYSTECTOMY    . COLON SURGERY    . EYE SURGERY    . HYSTEROTOMY    . LARYNX SURGERY      FAMILY HISTORY: The patient family history includes Cancer in her mother and sister; Coronary artery disease in her father, sister, sister, sister, and sister; Diabetes in her  daughter; Heart attack in her brother and father.  SOCIAL HISTORY:  The patient  reports that she quit smoking about 3 years ago. Her smoking use included cigarettes. She has a 30.00 pack-year smoking history. She has never used smokeless tobacco. She reports that she does not drink alcohol and does not use drugs.  REVIEW OF SYSTEMS: Review of Systems  Constitutional: Negative for chills and fever.  HENT: Negative for hoarse voice and nosebleeds.   Eyes: Negative for discharge, double vision and pain.  Cardiovascular: Positive for near-syncope (last episode thanksgiving weekend). Negative for chest pain, claudication, dyspnea on exertion, leg swelling, orthopnea, palpitations, paroxysmal nocturnal dyspnea and syncope.  Respiratory: Negative for hemoptysis and shortness of breath.   Musculoskeletal: Negative for muscle cramps and myalgias.  Gastrointestinal: Negative for abdominal pain, constipation, diarrhea, hematemesis, hematochezia, melena, nausea and vomiting.  Neurological: Positive for dizziness and light-headedness.  PHYSICAL EXAM: Vitals with BMI 08/05/2020 07/07/2020 05/10/2020  Height '5\' 5"'  '5\' 5"'  '5\' 5"'   Weight 146 lbs 147 lbs 10 oz 141 lbs 10 oz  BMI 24.3 59.93 57.01  Systolic 779 390 300  Diastolic 73 76 85  Pulse 80 73 74   Orthostatic VS for the past 72 hrs (Last 3 readings):  Patient Position BP Location Cuff Size  08/05/20 1404 Sitting Left Arm Normal   CONSTITUTIONAL: Well-developed and well-nourished. No acute distress.  SKIN: Skin is warm and dry. No rash noted. No cyanosis. No pallor. No jaundice HEAD: Normocephalic and atraumatic.  EYES: No scleral icterus MOUTH/THROAT: Moist oral membranes.  NECK: No JVD present. No thyromegaly noted. No carotid bruits  LYMPHATIC: No visible cervical adenopathy.  CHEST Normal respiratory effort. No intercostal retractions  LUNGS: Clear to auscultation bilaterally. No stridor. No wheezes. No rales.  CARDIOVASCULAR: Regular  rate and rhythm, positive S1-S2, no murmurs rubs or gallops appreciated. ABDOMINAL: No apparent ascites.  EXTREMITIES: No peripheral edema  HEMATOLOGIC: No significant bruising NEUROLOGIC: Oriented to person, place, and time. Nonfocal. Normal muscle tone.  PSYCHIATRIC: Normal mood and affect. Normal behavior. Cooperative  RADIOLOGY:  03/02/2020 CT chest lung cancer screening: Aortic atherosclerosis. Emphysema. Three-vessel coronary artery atherosclerosis  CARDIAC DATABASE: EKG: 07/07/2020: Normal sinus rhythm, 67 bpm, normal axis, T wave inversions in the high lateral leads and anteroseptal leads cannot rule out ischemia, without underlying injury pattern.  Echocardiogram: 08/03/2020:  Normal LV systolic function with visual EF 60-65%. Left ventricle cavity is normal in size. Normal global wall motion. No obvious regional wall motion abnormalities. Doppler evidence of grade I (impaired) diastolic  dysfunction, elevated LAP.  Left atrial cavity is mildly dilated.  Mild (Grade I) mitral regurgitation.  Mild tricuspid regurgitation. No evidence of pulmonary hypertension. RVSP measures 30 mmHg.  No prior study for comparison.   Stress Testing: Exercise Myoview stress test 07/18/2020: 1 Day Rest/Stress Protocol. Exercise time: 7 Minutes and 14 seconds on Bruce protocol.  Achieved 85% age-predicted maximum heart rate and 7.05 METs.  Functional capacity: Average Chest pain: None Heart rate & Blood pressure response: Physiological Stress ECG: Negative for ischemia. Normal myocardial perfusion without convincing evidence of myocardial ischemia or prior infarct. LVEF 66% without regional wall motion abnormalities. No prior studies for comparison. Low risk study.   Heart Catheterization: None  LABORATORY DATA: CBC Latest Ref Rng & Units 02/17/2020 11/17/2019 12/30/2018  WBC 3.4 - 10.8 x10E3/uL 6.8 7.0 5.8  Hemoglobin 11.1 - 15.9 g/dL 15.3 16.1(H) 14.5  Hematocrit 34.0 - 46.6 % 46.0  47.3(H) 44.0  Platelets 150 - 450 x10E3/uL 337 285 316    CMP Latest Ref Rng & Units 02/17/2020 11/17/2019 12/30/2018  Glucose 65 - 99 mg/dL 134(H) 112(H) 123(H)  BUN 8 - 27 mg/dL '11 12 11  ' Creatinine 0.57 - 1.00 mg/dL 0.78 0.78 0.63  Sodium 134 - 144 mmol/L 140 139 140  Potassium 3.5 - 5.2 mmol/L 4.8 5.2 4.6  Chloride 96 - 106 mmol/L 100 103 106  CO2 20 - 29 mmol/L '25 24 25  ' Calcium 8.7 - 10.3 mg/dL 9.5 9.4 9.4  Total Protein 6.0 - 8.5 g/dL 7.1 7.0 -  Total Bilirubin 0.0 - 1.2 mg/dL 0.3 0.3 -  Alkaline Phos 48 - 121 IU/L 73 95 -  AST 0 - 40 IU/L 13 21 -  ALT 0 - 32 IU/L 15 18 -    Lipid Panel  Lab Results  Component Value Date   CHOL 286 (H) 11/17/2019  HDL 39 (L) 11/17/2019   LDLCALC 181 (H) 11/17/2019   TRIG 337 (H) 11/17/2019   CHOLHDL 7.3 (H) 11/17/2019    No components found for: NTPROBNP No results for input(s): PROBNP in the last 8760 hours. Recent Labs    11/17/19 1106  TSH 0.994    BMP Recent Labs    11/17/19 1106 02/17/20 0927  NA 139 140  K 5.2 4.8  CL 103 100  CO2 24 25  GLUCOSE 112* 134*  BUN 12 11  CREATININE 0.78 0.78  CALCIUM 9.4 9.5  GFRNONAA 78 78  GFRAA 90 90    HEMOGLOBIN A1C Lab Results  Component Value Date   HGBA1C 7.1 (H) 02/17/2020   External Labs:  Date Collected: 06/08/2020 Creatinine 0.8 mg/dL. eGFR: 76 mL/min per 1.73 m Lipid profile: Total cholesterol 256, triglycerides 321, HDL 43, LDL 164, non HDL 213.  Hemoglobin A1c: 6.4  IMPRESSION:    ICD-10-CM   1. Coronary atherosclerosis due to calcified coronary lesion of native artery  I25.10    I25.84   2. Family history of premature CAD  Z82.49   3. Atherosclerosis of aorta (HCC)  I70.0   4. Lightheadedness  R42   5. Hx-TIA (transient ischemic attack)  Z86.73   6. Former smoker  Z87.891   7. Mixed hyperlipidemia  E78.2   8. Prediabetes  R73.03   9. OSA on CPAP  G47.33    Z99.89   10. Encounter to discuss test results  Z71.2      RECOMMENDATIONS: Brianna Nichols  is a 70 y.o. female whose past medical history and cardiac risk factors include: Family history of premature coronary artery disease, aortic atherosclerosis, coronary atherosclerosis due to calcified coronary lesion for nongated CT study, history of TIA, mixed hyperlipidemia, prediabetes, OSA on CPAP, postmenopausal female, advanced age.  Near-syncope:  Her prior episodes were most likely secondary to vasovagal etiology.  Orthostatic vital signs negative.  No history of malignant arrhythmias.  Echo and stress test results reviewed with her and her husband at today's visit as noted above over the reference.  No additional testing needed at this time.  Continue to monitor.  Coronary atherosclerosis noted on nongated CT study:  Patient has at least 40 year pack history of smoking and undergoes lung cancer screening CT periodically.  Most recent study from August 2021 noted three-vessel coronary artery atherosclerosis, aortic atherosclerosis as well.  Continue ASA and statin therapy.  Results of the echo and stress test reviewed with her and her husband at today's visit.  No additional testing warranted at this time.  Continue to monitor.    Lightheaded and dizziness:  Reviewed the images of the carotid duplex final report pending.  Patient has bilateral carotid artery atherosclerosis without significant stenosis.    She may also have a component of BPPV as the symptoms are brought on by turning her head briskly from side to side.  Defer further management to PCP.  Further recommendations to follow.  Mixed hyperlipidemia:  Since last visit patient states that her simvastatin dose has been increased to 40 mg p.o. nightly. Patient denies any myalgias. Currently managed by primary care provider.  Former smoker: Educated on the importance of continued smoking cessation.  History of TIA: Continue aspirin and statin therapy.  Educated on the importance of secondary  prevention.   FINAL MEDICATION LIST END OF ENCOUNTER: No orders of the defined types were placed in this encounter.    Current Outpatient Medications:  .  albuterol (  PROAIR HFA) 108 (90 Base) MCG/ACT inhaler, INHALE 2 PUFFS INTO LUNGS EVERY FOUR HOURS AS NEEDED, Disp: 18 g, Rfl: 2 .  alendronate (FOSAMAX) 70 MG tablet, Take 1 tablet (70 mg total) by mouth every 7 (seven) days. Take with a full glass of water on an empty stomach. Do not lay down for at least 2 hours, Disp: 4 tablet, Rfl: 11 .  ALPRAZolam (XANAX) 0.5 MG tablet, Take 1 tablet (0.5 mg total) by mouth as needed for anxiety., Disp: 30 tablet, Rfl: 5 .  aspirin EC 81 MG tablet, Take 1 tablet (81 mg total) by mouth daily. Swallow whole., Disp: 90 tablet, Rfl: 3 .  blood glucose meter kit and supplies, Dispense based on patient and insurance preference. Use up to four times daily as directed. (FOR ICD-10 E10.9, E11.9)., Disp: 1 each, Rfl: 0 .  Choline Fenofibrate (FENOFIBRIC ACID) 45 MG CPDR, TAKE ONE CAPSULE BY MOUTH DAILY FOR high cholesterol (Needs to be seen before next refill), Disp: 30 capsule, Rfl: 0 .  citalopram (CELEXA) 40 MG tablet, Take 1 tablet (40 mg total) by mouth daily., Disp: 90 tablet, Rfl: 1 .  Cyanocobalamin 1000 MCG/ML LIQD, Chew 1 gummy daily, Disp: , Rfl:  .  diclofenac (VOLTAREN) 75 MG EC tablet, TAKE 1 TABLET(75 MG) BY MOUTH MUSCLE TWICE DAILY FOR JOINT PAIN, Disp: 60 tablet, Rfl: 2 .  glucose blood (ONETOUCH VERIO) test strip, USE FOUR TIMES DAILY Dx R73.03, Disp: 400 strip, Rfl: 4 .  Omega-3 Fatty Acids (FISH OIL) 1000 MG CAPS, Take 2 capsules by mouth 2 (two) times daily., Disp: , Rfl:  .  omeprazole (PRILOSEC) 40 MG capsule, Take 40 mg by mouth as needed., Disp: , Rfl:  .  simvastatin (ZOCOR) 40 MG tablet, Take 40 mg by mouth at bedtime., Disp: , Rfl:   No orders of the defined types were placed in this encounter.   There are no Patient Instructions on file for this visit.   --Continue cardiac  medications as reconciled in final medication list. --Return in about 1 year (around 08/05/2021) for Follow up, CAD. Or sooner if needed. --Continue follow-up with your primary care physician regarding the management of your other chronic comorbid conditions.  Patient's questions and concerns were addressed to her satisfaction. She voices understanding of the instructions provided during this encounter.   This note was created using a voice recognition software as a result there may be grammatical errors inadvertently enclosed that do not reflect the nature of this encounter. Every attempt is made to correct such errors.  Brianna Nichols, [place], [facility]  Pager: [phone] Office: [phone]

## 2020-08-31 ENCOUNTER — Other Ambulatory Visit: Payer: Self-pay | Admitting: Family Medicine

## 2020-08-31 DIAGNOSIS — M25511 Pain in right shoulder: Secondary | ICD-10-CM

## 2020-10-04 ENCOUNTER — Encounter: Payer: Self-pay | Admitting: Orthopaedic Surgery

## 2020-10-04 ENCOUNTER — Ambulatory Visit: Payer: Self-pay

## 2020-10-04 ENCOUNTER — Ambulatory Visit: Payer: Medicare Other | Admitting: Orthopaedic Surgery

## 2020-10-04 ENCOUNTER — Other Ambulatory Visit: Payer: Self-pay

## 2020-10-04 VITALS — BP 126/83 | HR 73 | Ht 64.0 in | Wt 147.0 lb

## 2020-10-04 DIAGNOSIS — M25512 Pain in left shoulder: Secondary | ICD-10-CM | POA: Diagnosis not present

## 2020-10-04 DIAGNOSIS — M7542 Impingement syndrome of left shoulder: Secondary | ICD-10-CM | POA: Diagnosis not present

## 2020-10-04 MED ORDER — METHYLPREDNISOLONE ACETATE 40 MG/ML IJ SUSP
40.0000 mg | INTRAMUSCULAR | Status: AC | PRN
  Administered 2020-10-04: 40 mg via INTRA_ARTICULAR

## 2020-10-04 MED ORDER — LIDOCAINE HCL 1 % IJ SOLN
0.5000 mL | INTRAMUSCULAR | Status: AC | PRN
  Administered 2020-10-04: .5 mL

## 2020-10-04 MED ORDER — BUPIVACAINE HCL 0.25 % IJ SOLN
4.0000 mL | INTRAMUSCULAR | Status: AC | PRN
  Administered 2020-10-04: 4 mL via INTRA_ARTICULAR

## 2020-10-04 NOTE — Progress Notes (Signed)
Office Visit Note   Patient: Brianna Nichols           Date of Birth: Sep 14, 1950           MRN: [id number] Visit Date: 10/04/2020              Requested by: Trisha Mangle, FNP [address] [place],  [place] [address] PCP: Trisha Mangle, FNP   Assessment & Plan: Visit Diagnoses:  1. Left shoulder pain, unspecified chronicity     Plan: Repeat subacromial injection performed if she has recurrent symptoms she will need an MRI scan.  Procedure discussed diagnosis.  Previous x-rays reviewed.  She will call if she has persistent problems.  Follow-Up Instructions: No follow-ups on file.   Orders:  Orders Placed This Encounter  Procedures   XR Shoulder Left   No orders of the defined types were placed in this encounter.     Procedures: Large Joint Inj on 10/04/2020 1:20 PM Indications: pain Details: 22 G 1.5 in needle  Arthrogram: No  Medications: 4 mL bupivacaine 0.25 %; 40 mg methylPREDNISolone acetate 40 MG/ML; 0.5 mL lidocaine 1 % Outcome: tolerated well, no immediate complications Procedure, treatment alternatives, risks and benefits explained, specific risks discussed. Consent was given by the patient. Immediately prior to procedure a time out was called to verify the correct patient, procedure, equipment, support staff and site/side marked as required. Patient was prepped and draped in the usual sterile fashion.       Clinical Data: No additional findings.   Subjective: Chief Complaint  Patient presents with   Left Shoulder - Follow-up, Pain    HPI 70 year old female returns 7 months post left shoulder injection subacromially for impingement which gave her relief for many months.  She had recurrence in her PCP placed her on some prednisone which helped some but she still has trouble fixing her hair getting dressed reaching arm up outstretched or overhead on the left.  No problems with the right arm.  No associated neck symptoms no numbness or  tingling in her hand.  No chills or fever.  Review of Systems previous problems with TIA ,cigarette smoker.  Emphysema all other systems update unchanged from July 2021 office visit.   Objective: Vital Signs: BP 126/83 (BP Location: Left Arm, Patient Position: Sitting)    Pulse 73    Ht 5\' 4"  (1.626 m)    Wt 147 lb (66.7 kg)    BMI 25.23 kg/m   Physical Exam Constitutional:      Appearance: She is well-developed.  HENT:     Head: Normocephalic.     Right Ear: External ear normal.     Left Ear: External ear normal.  Eyes:     Pupils: Pupils are equal, round, and reactive to light.  Neck:     Thyroid: No thyromegaly.     Trachea: No tracheal deviation.  Cardiovascular:     Rate and Rhythm: Normal rate.  Pulmonary:     Effort: Pulmonary effort is normal.  Abdominal:     Palpations: Abdomen is soft.  Skin:    General: Skin is warm and dry.  Neurological:     Mental Status: She is alert and oriented to person, place, and time.  Psychiatric:        Behavior: Behavior normal.     Ortho Exam negative Spurling reflexes are 2+.  Abduction left shoulder to 90 degrees with pain positive impingement negative drop arm test.  No distal  biceps migration elbow reaches full extension.  Biceps triceps are strong.  Sensory of the hand is normal.  Full range of motion right shoulder.  Specialty Comments:  No specialty comments available.  Imaging: No results found.   PMFS History: Patient Active Problem List   Diagnosis Date Noted   Encounter for screening for lung cancer 03/02/2020   Complete tear of left rotator cuff 02/11/2020   Heartburn 08/26/2018   Post-nasal drip 08/26/2018   Transient ischemic attack (TIA), and cerebral infarction without residual deficits(V12.54) 06/03/2018   [place] shoulder syndrome, right 04/09/2018   Primary osteoarthritis of left knee 07/27/2016   Age related osteoporosis 07/20/2016   Closed fracture of distal end of left radius with  routine healing 09/01/2014   Prediabetes 11/18/2013   Cigarette smoker 09/18/2013   Right ear pain 09/18/2013   Abdominal pain 05/21/2013   Anxiety 04/02/2012   Centrilobular emphysema (HCC) 04/02/2012   Status post laparoscopic-assisted sigmoidectomy 10/12/2011   Primary hypercoagulable state (HCC) 06/29/2011   Coronary atherosclerosis 04/17/2011   Mixed hyperlipidemia 04/17/2011   Unifocal PVCs 04/17/2011   TOBACCO USER 02/14/2009   OBSTRUCTIVE SLEEP APNEA 02/14/2009   TIA 02/14/2009   ASTHMA 02/14/2009   PSORIASIS 02/14/2009   Past Medical History:  Diagnosis Date   Asthma    Cataract    Diverticulitis    Eczema    Hyperlipidemia    Obstructive sleep apnea    Pre-diabetes    Psoriasis     Family History  Problem Relation Age of Onset   Cancer Mother        Renal Cancer   Heart attack Father    Coronary artery disease Father    Cancer Sister        Pancreatic Cancer   Heart attack Brother    Coronary artery disease Sister    Coronary artery disease Sister    Coronary artery disease Sister    Coronary artery disease Sister    Diabetes Daughter     Past Surgical History:  Procedure Laterality Date   ABDOMINAL HYSTERECTOMY     CHOLECYSTECTOMY     COLON SURGERY     EYE SURGERY     HYSTEROTOMY     LARYNX SURGERY     Social History   Occupational History   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

## 2020-11-04 ENCOUNTER — Encounter (HOSPITAL_BASED_OUTPATIENT_CLINIC_OR_DEPARTMENT_OTHER): Payer: Self-pay | Admitting: *Deleted

## 2020-11-04 ENCOUNTER — Other Ambulatory Visit: Payer: Self-pay

## 2020-11-04 ENCOUNTER — Emergency Department (HOSPITAL_BASED_OUTPATIENT_CLINIC_OR_DEPARTMENT_OTHER)
Admission: EM | Admit: 2020-11-04 | Discharge: 2020-11-04 | Disposition: A | Discharge status: Home / Self Care | Payer: No Typology Code available for payment source | Attending: Emergency Medicine | Admitting: Emergency Medicine

## 2020-11-04 ENCOUNTER — Emergency Department (HOSPITAL_BASED_OUTPATIENT_CLINIC_OR_DEPARTMENT_OTHER): Payer: No Typology Code available for payment source | Admitting: Radiology

## 2020-11-04 ENCOUNTER — Emergency Department (HOSPITAL_BASED_OUTPATIENT_CLINIC_OR_DEPARTMENT_OTHER): Payer: No Typology Code available for payment source

## 2020-11-04 DIAGNOSIS — M546 Pain in thoracic spine: Secondary | ICD-10-CM | POA: Insufficient documentation

## 2020-11-04 DIAGNOSIS — Y92410 Unspecified street and highway as the place of occurrence of the external cause: Secondary | ICD-10-CM | POA: Diagnosis not present

## 2020-11-04 DIAGNOSIS — S161XXA Strain of muscle, fascia and tendon at neck level, initial encounter: Secondary | ICD-10-CM | POA: Insufficient documentation

## 2020-11-04 DIAGNOSIS — Z87891 Personal history of nicotine dependence: Secondary | ICD-10-CM | POA: Insufficient documentation

## 2020-11-04 DIAGNOSIS — M545 Low back pain, unspecified: Secondary | ICD-10-CM | POA: Diagnosis not present

## 2020-11-04 DIAGNOSIS — Z7982 Long term (current) use of aspirin: Secondary | ICD-10-CM | POA: Diagnosis not present

## 2020-11-04 DIAGNOSIS — J45909 Unspecified asthma, uncomplicated: Secondary | ICD-10-CM | POA: Insufficient documentation

## 2020-11-04 DIAGNOSIS — S199XXA Unspecified injury of neck, initial encounter: Secondary | ICD-10-CM | POA: Diagnosis present

## 2020-11-04 DIAGNOSIS — M25512 Pain in left shoulder: Secondary | ICD-10-CM | POA: Diagnosis not present

## 2020-11-04 MED ORDER — CYCLOBENZAPRINE HCL 10 MG PO TABS: 10.0000 mg | ORAL_TABLET | Freq: Two times a day (BID) | ORAL | 0 refills | Status: DC | PRN

## 2020-11-04 NOTE — Discharge Instructions (Signed)
Take the muscle relaxant medication as prescribed.  You can also take over-the-counter pain medications as well as over-the-counter topical pain medications such as Salonpas.  Follow-up with your doctor if symptoms do not resolve in the next week
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
## 2020-11-10 ENCOUNTER — Other Ambulatory Visit: Payer: Self-pay

## 2020-11-10 DIAGNOSIS — M25512 Pain in left shoulder: Secondary | ICD-10-CM

## 2020-11-11 ENCOUNTER — Ambulatory Visit (INDEPENDENT_AMBULATORY_CARE_PROVIDER_SITE_OTHER): Payer: Medicare Other | Admitting: Orthopaedic Surgery

## 2020-11-11 ENCOUNTER — Other Ambulatory Visit: Payer: Self-pay

## 2020-11-11 VITALS — BP 124/86 | HR 74

## 2020-11-11 DIAGNOSIS — S46012D Strain of muscle(s) and tendon(s) of the rotator cuff of left shoulder, subsequent encounter: Secondary | ICD-10-CM
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
## 2020-11-13 ENCOUNTER — Other Ambulatory Visit: Payer: Self-pay | Admitting: Family Medicine

## 2020-11-13 DIAGNOSIS — F419 Anxiety disorder, unspecified: Secondary | ICD-10-CM

## 2020-11-16 ENCOUNTER — Ambulatory Visit: Payer: Medicare Other | Admitting: Orthopaedic Surgery

## 2020-11-17 ENCOUNTER — Other Ambulatory Visit: Payer: Self-pay

## 2020-11-17 ENCOUNTER — Ambulatory Visit (INDEPENDENT_AMBULATORY_CARE_PROVIDER_SITE_OTHER): Payer: Medicare Other | Admitting: Orthopaedic Surgery

## 2020-11-17 DIAGNOSIS — M67819 Other specified disorders of synovium and tendon, unspecified shoulder: Secondary | ICD-10-CM

## 2020-11-17 DIAGNOSIS — M67812 Other specified disorders of synovium, left shoulder: Secondary | ICD-10-CM
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
## 2020-11-21 ENCOUNTER — Other Ambulatory Visit: Payer: Self-pay | Admitting: Family Medicine

## 2020-11-21 DIAGNOSIS — M810 Age-related osteoporosis without current pathological fracture: Secondary | ICD-10-CM

## 2020-11-21 DIAGNOSIS — M67819 Other specified disorders of synovium and tendon, unspecified shoulder: Secondary | ICD-10-CM | POA: Insufficient documentation

## 2020-11-24 ENCOUNTER — Emergency Department (HOSPITAL_COMMUNITY)
Admission: EM | Admit: 2020-11-24 | Discharge: 2020-11-24 | Disposition: A | Discharge status: Home / Self Care | Payer: No Typology Code available for payment source | Attending: Emergency Medicine | Admitting: Emergency Medicine

## 2020-11-24 ENCOUNTER — Encounter (HOSPITAL_COMMUNITY): Payer: Self-pay | Admitting: *Deleted

## 2020-11-24 ENCOUNTER — Other Ambulatory Visit: Payer: Self-pay

## 2020-11-24 ENCOUNTER — Emergency Department (HOSPITAL_COMMUNITY): Payer: No Typology Code available for payment source

## 2020-11-24 DIAGNOSIS — J45909 Unspecified asthma, uncomplicated: Secondary | ICD-10-CM | POA: Diagnosis not present

## 2020-11-24 DIAGNOSIS — M25511 Pain in right shoulder: Secondary | ICD-10-CM | POA: Insufficient documentation

## 2020-11-24 DIAGNOSIS — Z87891 Personal history of nicotine dependence: Secondary | ICD-10-CM | POA: Insufficient documentation

## 2020-11-24 DIAGNOSIS — Z7982 Long term (current) use of aspirin: Secondary | ICD-10-CM | POA: Diagnosis not present

## 2020-11-24 DIAGNOSIS — Z8673 Personal history of transient ischemic attack (TIA), and cerebral infarction without residual deficits: Secondary | ICD-10-CM | POA: Diagnosis not present

## 2020-11-24 DIAGNOSIS — G8929 Other chronic pain: Secondary | ICD-10-CM | POA: Diagnosis not present

## 2020-11-24 MED ORDER — KETOROLAC TROMETHAMINE 30 MG/ML IJ SOLN
30.0000 mg | Freq: Once | INTRAMUSCULAR | Status: AC
  Administered 2020-11-24: 30 mg via INTRAMUSCULAR
  Filled 2020-11-24: qty 1
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
## 2020-11-24 NOTE — Discharge Instructions (Addendum)
You can take your prescribed flexeril.  Only take this medication as prescribed. This medication can make you quite drowsy. Do not mix it with alcohol. Do not drive a vehicle after taking it.   Please follow-up with Dr. Ophelia Charter regarding your right shoulder.  You might benefit from a steroid injection in the shoulder.  You can always return to the emergency department if your symptoms worsen.  It was a pleasure to meet you.

## 2020-12-13 ENCOUNTER — Telehealth: Payer: Self-pay | Admitting: Orthopaedic Surgery

## 2020-12-13 NOTE — Telephone Encounter (Signed)
Please advise 

## 2020-12-13 NOTE — Telephone Encounter (Signed)
Patient called. Says she would like a MRI on her neck. Her call back number is [phone]

## 2020-12-13 NOTE — Telephone Encounter (Signed)
I CALLED DISCUSS AT ROV 12/22/20

## 2020-12-22 ENCOUNTER — Encounter: Payer: Self-pay | Admitting: Orthopaedic Surgery

## 2020-12-22 ENCOUNTER — Other Ambulatory Visit: Payer: Self-pay

## 2020-12-22 ENCOUNTER — Ambulatory Visit (INDEPENDENT_AMBULATORY_CARE_PROVIDER_SITE_OTHER): Payer: Medicare Other | Admitting: Orthopaedic Surgery

## 2020-12-22 VITALS — Ht 65.0 in | Wt 145.0 lb

## 2020-12-22 DIAGNOSIS — M542 Cervicalgia: Secondary | ICD-10-CM | POA: Diagnosis not present

## 2020-12-22 DIAGNOSIS — M2550 Pain in unspecified joint: Secondary | ICD-10-CM | POA: Diagnosis not present
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
## 2020-12-27 ENCOUNTER — Telehealth: Payer: Self-pay | Admitting: Radiology

## 2020-12-27 DIAGNOSIS — R768 Other specified abnormal immunological findings in serum: Secondary | ICD-10-CM

## 2020-12-27 DIAGNOSIS — M2550 Pain in unspecified joint: Secondary | ICD-10-CM

## 2020-12-27 NOTE — Telephone Encounter (Signed)
Dr. Ophelia Charter called and left voicemail for patient regarding lab results from [facility]. Test for gout was negative. ANA is only slightly elevated, however, he is going refer to a rheumatologist to see if they feel that she needs additional testing.   Referral placed for Dr. Corliss Skains

## 2021-01-05 ENCOUNTER — Ambulatory Visit: Payer: Medicare Other | Admitting: Orthopaedic Surgery

## 2021-02-07 ENCOUNTER — Ambulatory Visit: Payer: Medicare Other | Admitting: Internal Medicine

## 2021-02-22 ENCOUNTER — Other Ambulatory Visit: Payer: Self-pay | Admitting: *Deleted

## 2021-02-22 DIAGNOSIS — Z87891 Personal history of nicotine dependence: Secondary | ICD-10-CM

## 2021-02-27 ENCOUNTER — Telehealth: Payer: Self-pay | Admitting: Acute Care

## 2021-02-27 NOTE — Telephone Encounter (Signed)
I have spoke with Brianna Nichols and her LCS CT has been scheduled at [facility] on 03/15/2021 @ 6:00pm and she is aware

## 2021-03-08 NOTE — Progress Notes (Deleted)
Office Visit Note  Patient: Brianna Nichols             Date of Birth: 03-Jan-1951           MRN: [id number]             PCP: Joya Gaskins, FNP Referring: Marybelle Killings, MD Visit Date: 03/09/2021 Occupation: _0 @  Subjective:  No chief complaint on file.   History of Present Illness: Brianna Nichols is a 70 y.o. female here for positive ANA and multiple joint pains. Shoulder xrays from 2021 and 2022 demonstrate calcific tendinopathy is present bilaterally.***   Labs reviewed 12/2020 ANA 1:80 homogenous, speckled ESR 26 hsCRP 3.04 Uric acid 5.5   Activities of Daily Living:  Patient reports morning stiffness for *** {minute/hour:19697}.   Patient {ACTIONS;DENIES/REPORTS:21021675::"Denies"} nocturnal pain.  Difficulty dressing/grooming: {ACTIONS;DENIES/REPORTS:21021675::"Denies"} Difficulty climbing stairs: {ACTIONS;DENIES/REPORTS:21021675::"Denies"} Difficulty getting out of chair: {ACTIONS;DENIES/REPORTS:21021675::"Denies"} Difficulty using hands for taps, buttons, cutlery, and/or writing: {ACTIONS;DENIES/REPORTS:21021675::"Denies"}  No Rheumatology ROS completed.   PMFS History:  Patient Active Problem List   Diagnosis Date Noted   Tendinosis of rotator cuff 11/21/2020   Encounter for screening for lung cancer 03/02/2020   Complete tear of left rotator cuff 02/11/2020   Heartburn 08/26/2018   Post-nasal drip 08/26/2018   Transient ischemic attack (TIA), and cerebral infarction without residual deficits(V12.54) 06/03/2018   [place] shoulder syndrome, right 04/09/2018   Primary osteoarthritis of left knee 07/27/2016   Age related osteoporosis 07/20/2016   Closed fracture of distal end of left radius with routine healing 09/01/2014   Prediabetes 11/18/2013   Cigarette smoker 09/18/2013   Right ear pain 09/18/2013   Abdominal pain 05/21/2013   Anxiety 04/02/2012   Centrilobular emphysema ([place]) 04/02/2012   Status post laparoscopic-assisted sigmoidectomy  10/12/2011   Primary hypercoagulable state ([place]) 06/29/2011   Coronary atherosclerosis 04/17/2011   Mixed hyperlipidemia 04/17/2011   Unifocal PVCs 04/17/2011   TOBACCO USER 02/14/2009   OBSTRUCTIVE SLEEP APNEA 02/14/2009   TIA 02/14/2009   ASTHMA 02/14/2009   PSORIASIS 02/14/2009    Past Medical History:  Diagnosis Date   Asthma    Cataract    Diverticulitis    Eczema    Hyperlipidemia    Obstructive sleep apnea    Pre-diabetes    Psoriasis     Family History  Problem Relation Age of Onset   Cancer Mother        Renal Cancer   Heart attack Father    Coronary artery disease Father    Cancer Sister        Pancreatic Cancer   Heart attack Brother    Coronary artery disease Sister    Coronary artery disease Sister    Coronary artery disease Sister    Coronary artery disease Sister    Diabetes Daughter    Past Surgical History:  Procedure Laterality Date   ABDOMINAL HYSTERECTOMY     CHOLECYSTECTOMY     COLON SURGERY     EYE SURGERY     HYSTEROTOMY     LARYNX SURGERY     Social History   Social History Narrative   Not on file   Immunization History  Administered Date(s) Administered   PFIZER(Purple Top)SARS-COV-2 Vaccination 12/31/2019, 02/04/2020   Td 03/26/2001   Tdap 03/30/2010, 05/28/2015     Objective: Vital Signs: There were no vitals taken for this visit.   Physical Exam   Musculoskeletal Exam: ***  CDAI Exam: CDAI Score: -- Patient Global: --; Provider Global: -- Swollen: --;  Tender: -- Joint Exam 03/09/2021   No joint exam has been documented for this visit   There is currently no information documented on the homunculus. Go to the Rheumatology activity and complete the homunculus joint exam.  Investigation: No additional findings.  Imaging: No results found.  Recent Labs: Lab Results  Component Value Date   WBC 6.8 02/17/2020   HGB 15.3 02/17/2020   PLT 337 02/17/2020   NA 140 02/17/2020   K 4.8 02/17/2020   CL 100  02/17/2020   CO2 25 02/17/2020   GLUCOSE 134 (H) 02/17/2020   BUN 11 02/17/2020   CREATININE 0.78 02/17/2020   BILITOT 0.3 02/17/2020   ALKPHOS 73 02/17/2020   AST 13 02/17/2020   ALT 15 02/17/2020   PROT 7.1 02/17/2020   ALBUMIN 4.2 02/17/2020   CALCIUM 9.5 02/17/2020   GFRAA 90 02/17/2020    Speciality Comments: No specialty comments available.  Procedures:  No procedures performed Allergies: Gabapentin, Morphine, Nalbuphine, Penicillins, Bee pollen, Diphenhydramine hcl, Diphenhydramine hcl (sleep), Hydrocodone-acetaminophen, Levofloxacin, Pollen extract, Shellfish allergy, Metronidazole, Moxifloxacin, Pedi-pre tape spray [wound dressing adhesive], Sulfa antibiotics, Sulfamethoxazole, and Tape   Assessment / Plan:     Visit Diagnoses: No diagnosis found.  Orders: No orders of the defined types were placed in this encounter.  No orders of the defined types were placed in this encounter.   Face-to-face time spent with patient was *** minutes. Greater than 50% of time was spent in counseling and coordination of care.  Follow-Up Instructions: No follow-ups on file.   Collier Salina, MD  Note - This record has been created using [employer].  Chart creation errors have been sought, but may not always  have been located. Such creation errors do not reflect on  the standard of medical care.

## 2021-03-09 ENCOUNTER — Ambulatory Visit: Payer: Medicare Other | Admitting: Internal Medicine

## 2021-03-15 ENCOUNTER — Other Ambulatory Visit: Payer: Self-pay

## 2021-03-15 ENCOUNTER — Ambulatory Visit (HOSPITAL_COMMUNITY)
Admission: RE | Admit: 2021-03-15 | Discharge: 2021-03-15 | Disposition: A | Discharge status: Home / Self Care | Payer: Medicare Other | Source: Ambulatory Visit | Attending: Acute Care | Admitting: Acute Care

## 2021-03-15 DIAGNOSIS — Z87891 Personal history of nicotine dependence: Secondary | ICD-10-CM | POA: Insufficient documentation

## 2021-03-16 ENCOUNTER — Other Ambulatory Visit (HOSPITAL_COMMUNITY): Payer: Self-pay | Admitting: Family Medicine

## 2021-03-16 DIAGNOSIS — Z1231 Encounter for screening mammogram for malignant neoplasm of breast: Secondary | ICD-10-CM

## 2021-03-20 ENCOUNTER — Inpatient Hospital Stay
Admission: RE | Admit: 2021-03-20 | Discharge: 2021-03-20 | Disposition: A | Discharge status: Home / Self Care | Payer: Self-pay | Source: Ambulatory Visit | Attending: Family Medicine | Admitting: Family Medicine

## 2021-03-20 ENCOUNTER — Other Ambulatory Visit (HOSPITAL_COMMUNITY): Payer: Self-pay | Admitting: Family Medicine

## 2021-03-20 DIAGNOSIS — Z1231 Encounter for screening mammogram for malignant neoplasm of breast: Secondary | ICD-10-CM

## 2021-03-22 ENCOUNTER — Ambulatory Visit (HOSPITAL_COMMUNITY): Payer: Medicare Other

## 2021-03-27 NOTE — Progress Notes (Signed)
Please call patient and let them  know their  low dose Ct was read as a Lung RADS 2: nodules that are benign in appearance and behavior with a very low likelihood of becoming a clinically active cancer due to size or lack of growth. Recommendation per radiology is for a repeat LDCT in 12 months. .Please let them  know we will order and schedule their  annual screening scan for 02/2022. Please let them  know there was notation of CAD on their  scan.  Please remind the patient  that this is a non-gated exam therefore degree or severity of disease  cannot be determined. Please have them  follow up with their PCP regarding potential risk factor modification, dietary therapy or pharmacologic therapy if clinically indicated. Pt.  is  currently on statin therapy. Please place order for annual  screening scan for  02/2022 and fax results to PCP. Thanks so much.  + CAD, On statin, followed by cards

## 2021-03-28 ENCOUNTER — Encounter: Payer: Self-pay | Admitting: *Deleted

## 2021-03-28 DIAGNOSIS — Z87891 Personal history of nicotine dependence: Secondary | ICD-10-CM

## 2021-04-26 ENCOUNTER — Ambulatory Visit (HOSPITAL_COMMUNITY): Payer: Medicare Other

## 2021-05-29 ENCOUNTER — Ambulatory Visit (HOSPITAL_COMMUNITY)
Admission: RE | Admit: 2021-05-29 | Discharge: 2021-05-29 | Disposition: A | Discharge status: Home / Self Care | Payer: Medicare Other | Source: Ambulatory Visit | Attending: Family Medicine | Admitting: Family Medicine

## 2021-05-29 ENCOUNTER — Other Ambulatory Visit: Payer: Self-pay

## 2021-05-29 DIAGNOSIS — Z1231 Encounter for screening mammogram for malignant neoplasm of breast: Secondary | ICD-10-CM | POA: Diagnosis present

## 2021-08-07 ENCOUNTER — Ambulatory Visit: Payer: Medicare Other | Admitting: Cardiology

## 2021-08-16 ENCOUNTER — Ambulatory Visit: Payer: Medicare Other | Admitting: Cardiology

## 2021-08-31 ENCOUNTER — Ambulatory Visit: Payer: Medicare Other | Admitting: Physician Assistant

## 2021-08-31 ENCOUNTER — Other Ambulatory Visit: Payer: Self-pay

## 2021-08-31 ENCOUNTER — Encounter: Payer: Self-pay | Admitting: Physician Assistant

## 2021-08-31 DIAGNOSIS — Z1283 Encounter for screening for malignant neoplasm of skin: Secondary | ICD-10-CM

## 2021-08-31 DIAGNOSIS — L309 Dermatitis, unspecified: Secondary | ICD-10-CM | POA: Diagnosis not present

## 2021-08-31 DIAGNOSIS — L570 Actinic keratosis: Secondary | ICD-10-CM

## 2021-08-31 DIAGNOSIS — L03115 Cellulitis of right lower limb: Secondary | ICD-10-CM | POA: Diagnosis not present

## 2021-08-31 DIAGNOSIS — L820 Inflamed seborrheic keratosis: Secondary | ICD-10-CM

## 2021-08-31 MED ORDER — CLOBETASOL PROP EMOLLIENT BASE 0.05 % EX CREA
1.0000 | TOPICAL_CREAM | Freq: Two times a day (BID) | CUTANEOUS | 3 refills | Status: DC
Start: 2021-08-31 — End: 2024-03-12

## 2021-09-24 ENCOUNTER — Encounter: Payer: Self-pay | Admitting: Physician Assistant

## 2021-09-24 NOTE — Progress Notes (Signed)
° °  New Patient   Subjective  Brianna Nichols is a 71 y.o. female who presents for the following: Annual Exam (Full body skin check, cellulitis right lower leg better with 3 rounds of antibiotic from pcp doxycycline. No personal h/o of skin cancer or atypia.).   The following portions of the chart were reviewed this encounter and updated as appropriate:  Tobacco   Allergies   Meds   Problems   Med Hx   Surg Hx   Fam Hx       Objective  Well appearing patient in no apparent distress; mood and affect are within normal limits.  A full examination was performed including scalp, head, eyes, ears, nose, lips, neck, chest, axillae, abdomen, back, buttocks, bilateral upper extremities, bilateral lower extremities, hands, feet, fingers, toes, fingernails, and toenails. All findings within normal limits unless otherwise noted below.  Right Forehead Brown crusts on an erythematous base.   Dorsum of Nose, Left Malar Cheek Erythematous patches with gritty scale.  Left Elbow - Posterior, Right Elbow - Posterior Legs have slight hyperpigmentation and are markedly improved at the time of the visit.    Assessment & Plan  Seborrheic keratosis, inflamed Right Forehead  Destruction of lesion - Right Forehead Complexity: simple   Destruction method: cryotherapy   Informed consent: discussed and consent obtained   Timeout:  patient name, date of birth, surgical site, and procedure verified Lesion destroyed using liquid nitrogen: Yes   Cryotherapy cycles:  3 Outcome: patient tolerated procedure well with no complications   Post-procedure details: wound care instructions given    AK (actinic keratosis) (2) Dorsum of Nose; Left Malar Cheek  Destruction of lesion - Dorsum of Nose, Left Malar Cheek Complexity: simple   Destruction method: cryotherapy   Informed consent: discussed and consent obtained   Timeout:  patient name, date of birth, surgical site, and procedure verified Lesion destroyed using  liquid nitrogen: Yes   Cryotherapy cycles:  3 Outcome: patient tolerated procedure well with no complications   Post-procedure details: wound care instructions given    Dermatitis Left Elbow - Posterior; Right Elbow - Posterior  Clobetasol Prop Emollient Base (CLOBETASOL PROPIONATE E) 0.05 % emollient cream - Left Elbow - Posterior, Right Elbow - Posterior Apply 1 application topically 2 (two) times daily.   No atypical nevi noted at the time of the visit.  I, Samyria Rudie, PA-C, have reviewed all documentation's for this visit.  The documentation on 09/24/21 for the exam, diagnosis, procedures and orders are all accurate and complete.

## 2021-10-19 ENCOUNTER — Other Ambulatory Visit: Payer: Self-pay | Admitting: Family Medicine

## 2021-10-19 DIAGNOSIS — M810 Age-related osteoporosis without current pathological fracture: Secondary | ICD-10-CM

## 2021-11-29 IMAGING — CT CT CHEST LUNG CANCER SCREENING LOW DOSE W/O CM
1 of 2 series · 10 of 20 positions shown, 13 images · non-contrast
Comparison: CTA chest dated 12/30/2018. Low-dose lung cancer
screening CT chest dated 07/01/2018

CLINICAL DATA: 68-year-old female former smoker, quit 3 years ago,
with 44 pack-year history of smoking, for follow-up lung cancer
screening

EXAM:
CT CHEST WITHOUT CONTRAST LOW-DOSE FOR LUNG CANCER SCREENING
TECHNIQUE: Multidetector CT imaging of the chest was performed following the
standard protocol without IV contrast.

[ct lung segmentation data · axial · 0.62mm/px · z∈[+1285,+1285]mm · 10 of 261 frames shown]
[frame 1/261  mediastinal]
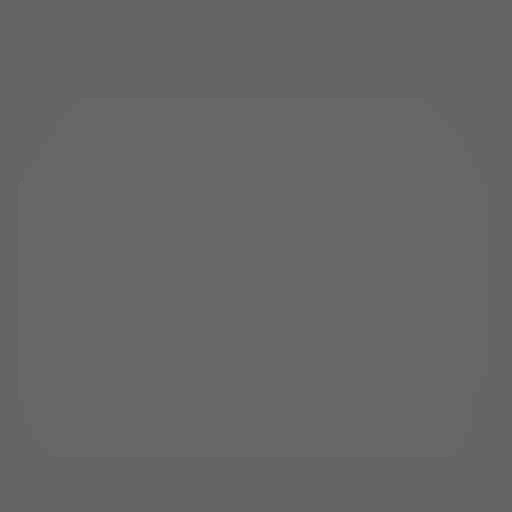
[frame 1/261  lung]
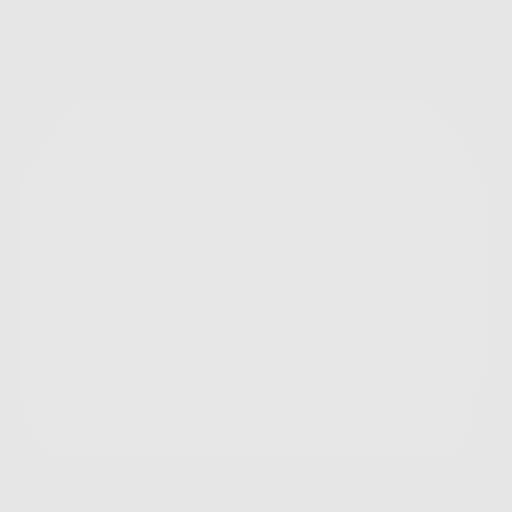
[frame 29/261  lung]
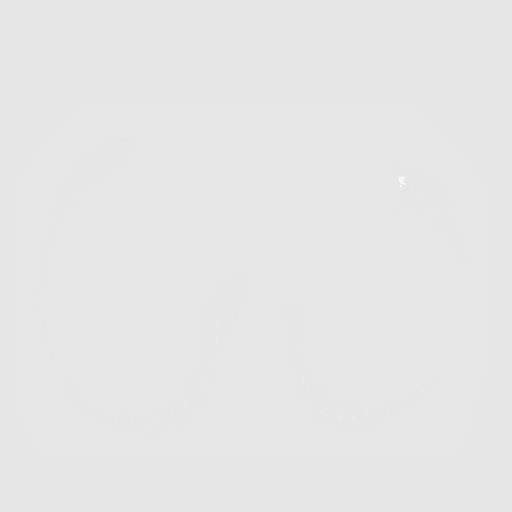
[frame 58/261  lung]
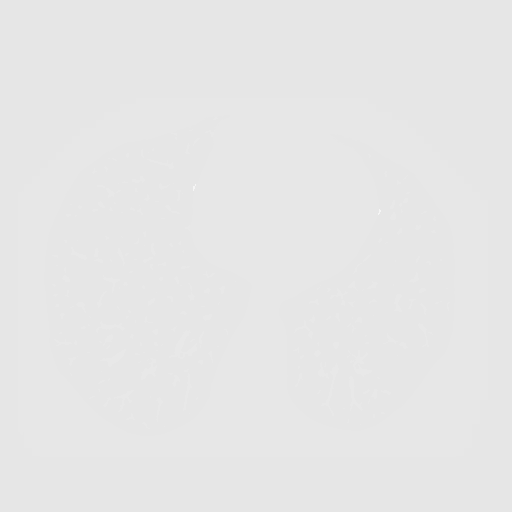
[frame 87/261  lung]
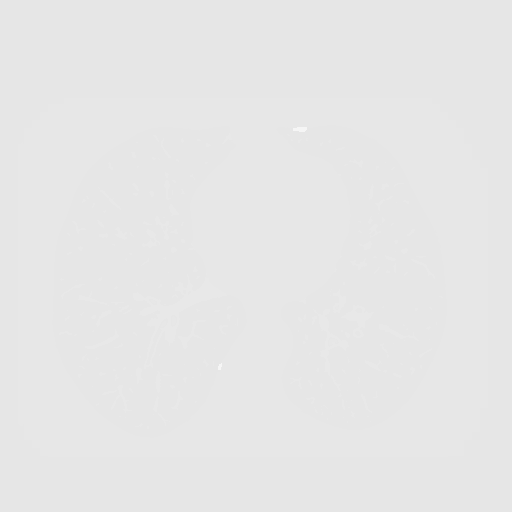
[frame 116/261  mediastinal]
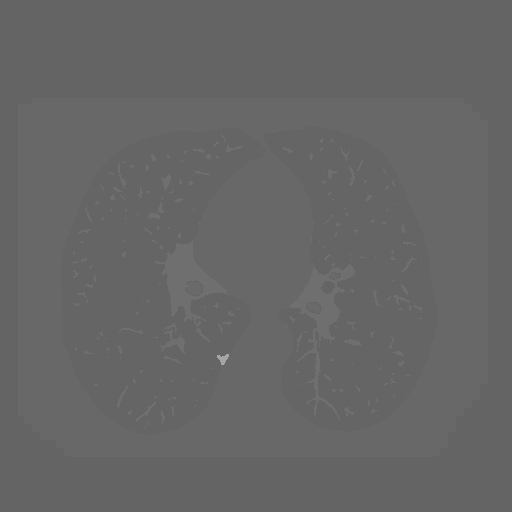
[frame 116/261  lung]
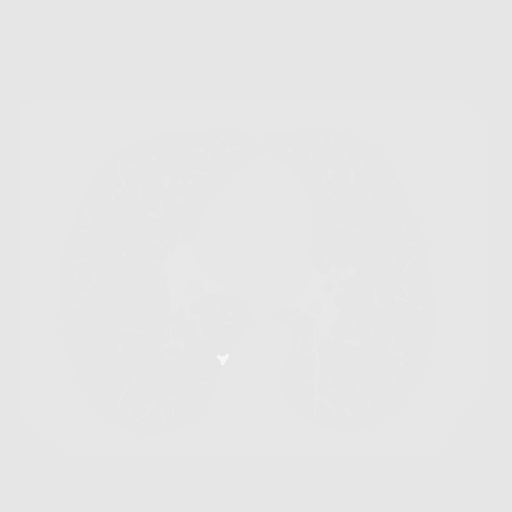
[frame 145/261  lung]
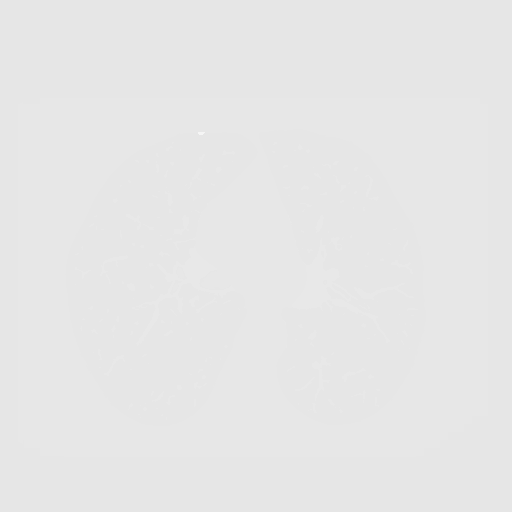
[frame 174/261  lung]
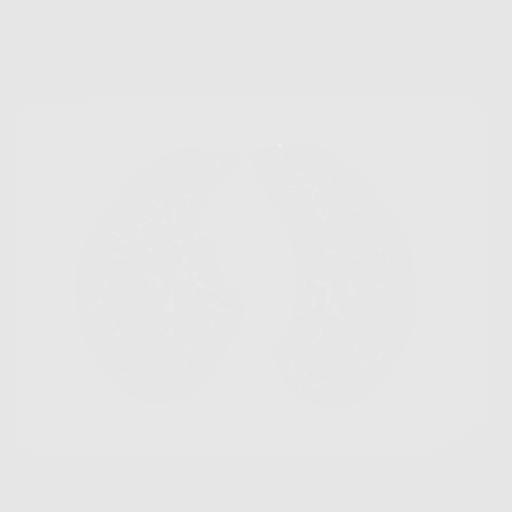
[frame 203/261  lung]
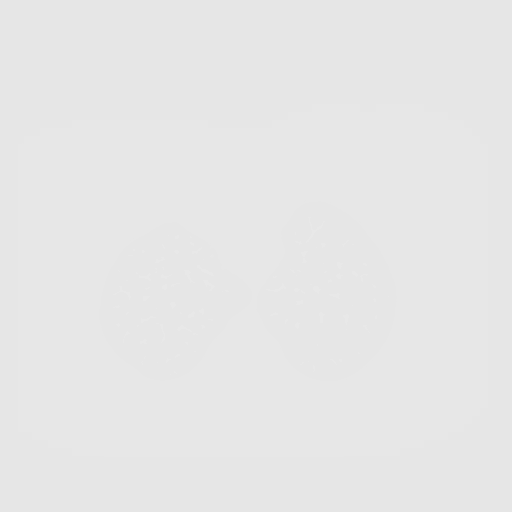
[frame 232/261  mediastinal]
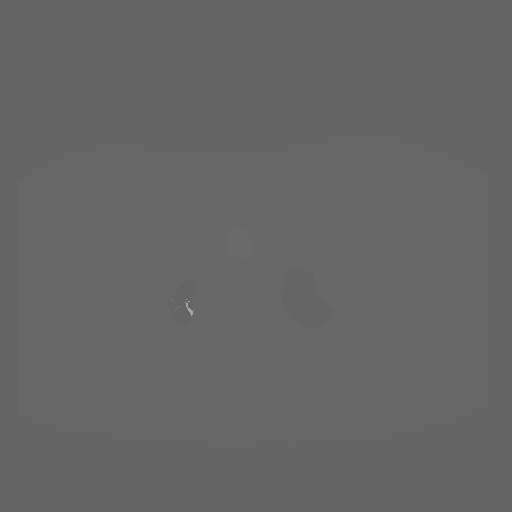
[frame 232/261  lung]
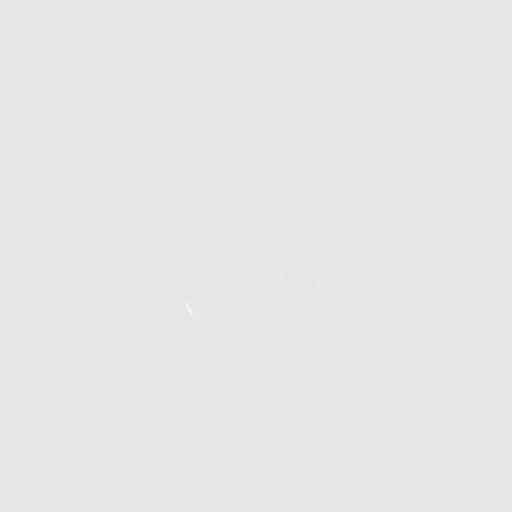
[frame 261/261  lung]
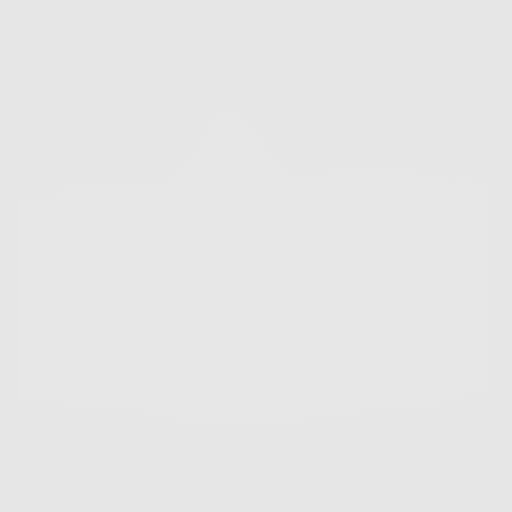

[10 of 20 positions shown; findings below may reference images not displayed]

FINDINGS: Cardiovascular: The heart is normal in size. No pericardial
effusion.

No evidence of thoracic aortic aneurysm. Mild atherosclerotic
calcifications of the aortic arch.

Three vessel coronary atherosclerosis.

Mediastinum/Nodes: No suspicious mediastinal lymphadenopathy.

Visualized thyroid is unremarkable.

Lungs/Pleura: Mild biapical pleural-parenchymal scarring.

Mild centrilobular and paraseptal emphysematous changes, upper lung
predominant.

Bronchiectasis or bronchial wall thickening in the bilateral lower
lobes.

No focal consolidation.

Small bilateral pulmonary nodules measuring up to 3.6 mm.

No pleural effusion or pneumothorax.

Upper Abdomen: Visualized upper abdomen is grossly unremarkable,
noting mild vascular calcifications.

Musculoskeletal: Degenerative changes of the visualized
thoracolumbar spine.
IMPRESSION: Lung-RADS 2, benign appearance or behavior. Continue annual
screening with low-dose chest CT without contrast in 12 months.

Aortic Atherosclerosis (159H2-8AP.P) and Emphysema (159H2-IO9.F).

## 2021-12-25 DIAGNOSIS — R55 Syncope and collapse: Secondary | ICD-10-CM | POA: Insufficient documentation

## 2021-12-25 DIAGNOSIS — I700 Atherosclerosis of aorta: Secondary | ICD-10-CM | POA: Insufficient documentation

## 2021-12-25 NOTE — Progress Notes (Deleted)
Cardiology Office Note   Date:  12/25/2021   ID:  Brianna Nichols, DOB [birth year], MRN [id number]  PCP:  Joya Gaskins, FNP  Cardiologist:   None Referring:  ***  No chief complaint on file.     History of Present Illness: Brianna Nichols is a 71 y.o. female who presents for evaluation of syncope.  She saw another cardiology group in town. Echo in Jan demonstrated a normal EF and mild MR and TR.  SPECT was negative.   She did have atherosclerosis in the aorta and coronaries on screening CT.   ***      Echocardiogram: 08/03/2020:  Normal LV systolic function with visual EF 60-65%. Left ventricle cavity is normal in size. Normal global wall motion. No obvious regional wall motion abnormalities. Doppler evidence of grade I (impaired) diastolic  dysfunction, elevated LAP.  Left atrial cavity is mildly dilated.  Mild (Grade I) mitral regurgitation.  Mild tricuspid regurgitation. No evidence of pulmonary hypertension. RVSP measures 30 mmHg.  No prior study for comparison.   Stress Testing: Exercise Myoview stress test 07/18/2020: 1 Day Rest/Stress Protocol. Exercise time: 7 Minutes and 14 seconds on Bruce protocol.  Achieved 85% age-predicted maximum heart rate and 7.05 METs.  Functional capacity: Average Chest pain: None Heart rate & Blood pressure response: Physiological Stress ECG: Negative for ischemia. Normal myocardial perfusion without convincing evidence of myocardial ischemia or prior infarct. LVEF 66% without regional wall motion abnormalities. No prior studies for comparison. Low risk study.    71 y.o. female who presents to the office with a chief complaint of " reevaluation of near syncope and review test results." Patient's past medical history and cardiovascular risk factors include: Family history of premature coronary artery disease, aortic atherosclerosis, coronary atherosclerosis due to calcified coronary lesion for nongated CT study, history of TIA, mixed  hyperlipidemia, prediabetes, OSA on CPAP, postmenopausal female, advanced age.   She is referred to the office at the request of Joya Gaskins, * for evaluation of syncope.  Patient is accompanied by her husband Juanda Crumble at today's visit.  Patient was referred to the office after having a vasovagal syncope during the Thanksgiving weekend while she was at her daughter's place.  Since last visit she has not had any recurrence of symptoms.  At the last office visit she was also noted to have coronary artery calcification and on gated CT study that she has for routine lung cancer screening given her history of smoking.  She was recommended to start aspirin and statin therapy.  She is tolerating the medications well without any side effects or intolerances.  She also underwent an ischemic evaluation including an echo and stress test.  Results was reviewed with both her and her husband at today's visit.  In addition, given her history of TIA, episodes of vasovagal syncope, and continued lightheaded and dizziness recommended undergoing carotid duplex to rule out carotid artery stenosis.  Images reviewed final report pending but overall patient is noted to have bilateral carotid artery atherosclerosis without any significant stenosis.  She was also recommended to follow-up with ENT to rule out BPPV; however, patient failed to do so.  Family history of premature coronary disease: Brother had MI at age of 74.    Past Medical History:  Diagnosis Date   Asthma    Cataract    Diverticulitis    Eczema    Hyperlipidemia    Obstructive sleep apnea    Pre-diabetes    Psoriasis  Past Surgical History:  Procedure Laterality Date   ABDOMINAL HYSTERECTOMY     CHOLECYSTECTOMY     COLON SURGERY     EYE SURGERY     HYSTEROTOMY     LARYNX SURGERY       Current Outpatient Medications  Medication Sig Dispense Refill   albuterol (PROAIR HFA) 108 (90 Base) MCG/ACT inhaler INHALE 2 PUFFS INTO LUNGS  EVERY FOUR HOURS AS NEEDED 18 g 2   alendronate (FOSAMAX) 70 MG tablet Take 1 tablet (70 mg total) by mouth every 7 (seven) days. Take with a full glass of water on an empty stomach. Do not lay down for at least 2 hours 4 tablet 11   ALPRAZolam (XANAX) 0.5 MG tablet Take 1 tablet (0.5 mg total) by mouth as needed for anxiety. 30 tablet 5   aspirin EC 81 MG tablet Take 1 tablet (81 mg total) by mouth daily. Swallow whole. 90 tablet 3   blood glucose meter kit and supplies Dispense based on patient and insurance preference. Use up to four times daily as directed. (FOR ICD-10 E10.9, E11.9). 1 each 0   Choline Fenofibrate (FENOFIBRIC ACID) 45 MG CPDR TAKE ONE CAPSULE BY MOUTH DAILY FOR high cholesterol (Needs to be seen before next refill) 30 capsule 0   citalopram (CELEXA) 40 MG tablet Take 1 tablet (40 mg total) by mouth daily. 90 tablet 1   Clobetasol Prop Emollient Base (CLOBETASOL PROPIONATE E) 0.05 % emollient cream Apply 1 application topically 2 (two) times daily. 60 g 3   Cyanocobalamin 1000 MCG/ML LIQD Chew 1 gummy daily     cyclobenzaprine (FLEXERIL) 10 MG tablet Take 1 tablet (10 mg total) by mouth 2 (two) times daily as needed for muscle spasms. 20 tablet 0   diclofenac (VOLTAREN) 75 MG EC tablet TAKE 1 TABLET(75 MG) BY MOUTH MUSCLE TWICE DAILY FOR JOINT PAIN 60 tablet 2   Dulaglutide (TRULICITY) 3.71 IR/6.7EL SOPN Inject into the skin.     glucose blood (ONETOUCH VERIO) test strip USE FOUR TIMES DAILY Dx R73.03 400 strip 4   mupirocin cream (BACTROBAN) 2 % Apply topically 3 (three) times daily.     Omega-3 Fatty Acids (FISH OIL) 1000 MG CAPS Take 2 capsules by mouth 2 (two) times daily.     simvastatin (ZOCOR) 40 MG tablet Take 40 mg by mouth at bedtime.     triamcinolone cream (KENALOG) 0.1 % Apply to affected area bid     No current facility-administered medications for this visit.    Allergies:   Gabapentin, Morphine, Nalbuphine, Penicillins, Bee pollen, Diphenhydramine hcl,  Diphenhydramine hcl (sleep), Hydrocodone-acetaminophen, Levofloxacin, Pollen extract, Shellfish allergy, Metronidazole, Moxifloxacin, Pedi-pre tape spray [wound dressing adhesive], Sulfa antibiotics, Sulfamethoxazole, and Tape    Social History:  The patient  reports that she quit smoking about 5 years ago. Her smoking use included cigarettes. She has a 30.00 pack-year smoking history. She has never used smokeless tobacco. She reports that she does not drink alcohol and does not use drugs.   Family History:  The patient's ***family history includes Cancer in her mother and sister; Coronary artery disease in her father, sister, sister, sister, and sister; Diabetes in her daughter; Heart attack in her brother and father.    ROS:  Please see the history of present illness.   Otherwise, review of systems are positive for {NONE DEFAULTED:18576}.   All other systems are reviewed and negative.    PHYSICAL EXAM: VS:  There were no vitals taken for this  visit. , BMI There is no height or weight on file to calculate BMI. GENERAL:  Well appearing HEENT:  Pupils equal round and reactive, fundi not visualized, oral mucosa unremarkable NECK:  No jugular venous distention, waveform within normal limits, carotid upstroke brisk and symmetric, no bruits, no thyromegaly LYMPHATICS:  No cervical, inguinal adenopathy LUNGS:  Clear to auscultation bilaterally BACK:  No CVA tenderness CHEST:  Unremarkable HEART:  PMI not displaced or sustained,S1 and S2 within normal limits, no S3, no S4, no clicks, no rubs, *** murmurs ABD:  Flat, positive bowel sounds normal in frequency in pitch, no bruits, no rebound, no guarding, no midline pulsatile mass, no hepatomegaly, no splenomegaly EXT:  2 plus pulses throughout, no edema, no cyanosis no clubbing SKIN:  No rashes no nodules NEURO:  Cranial nerves II through XII grossly intact, motor grossly intact throughout PSYCH:  Cognitively intact, oriented to person place and  time    EKG:  EKG {ACTION; IS/IS OIN:[id number]} ordered today. The ekg ordered today demonstrates ***   Recent Labs: No results found for requested labs within last 8760 hours.    Lipid Panel    Component Value Date/Time   CHOL 286 (H) 11/17/2019 1106   TRIG 337 (H) 11/17/2019 1106   HDL 39 (L) 11/17/2019 1106   CHOLHDL 7.3 (H) 11/17/2019 1106   CHOLHDL 8.4 07/21/2009 0345   VLDL UNABLE TO CALCULATE IF TRIGLYCERIDE OVER 400 mg/dL 07/21/2009 0345   LDLCALC 181 (H) 11/17/2019 1106      Wt Readings from Last 3 Encounters:  12/22/20 145 lb (65.8 kg)  11/24/20 146 lb (66.2 kg)  11/04/20 146 lb (66.2 kg)      Other studies Reviewed: Additional studies/ records that were reviewed today include: ***. Review of the above records demonstrates:  Please see elsewhere in the note.  ***   ASSESSMENT AND PLAN:  Syncope:  ***  Aortic atherosclerosis:  ***  Elevated coronary calcium:  ***  Dyslipidemia:  ***   Current medicines are reviewed at length with the patient today.  The patient {ACTIONS; HAS/DOES NOT HAVE:19233} concerns regarding medicines.  The following changes have been made:  {PLAN; NO CHANGE:13088:s}  Labs/ tests ordered today include: *** No orders of the defined types were placed in this encounter.    Disposition:   FU with ***    Signed, Minus Breeding, MD  12/25/2021 7:10 PM    [facility]

## 2021-12-26 ENCOUNTER — Ambulatory Visit: Payer: Medicare Other | Admitting: Cardiology

## 2021-12-26 DIAGNOSIS — I2510 Atherosclerotic heart disease of native coronary artery without angina pectoris: Secondary | ICD-10-CM

## 2021-12-26 DIAGNOSIS — I700 Atherosclerosis of aorta: Secondary | ICD-10-CM

## 2021-12-26 DIAGNOSIS — R55 Syncope and collapse: Secondary | ICD-10-CM

## 2022-01-03 ENCOUNTER — Ambulatory Visit: Payer: Medicare Other | Admitting: Cardiology

## 2022-03-15 ENCOUNTER — Ambulatory Visit (HOSPITAL_COMMUNITY): Admission: RE | Admit: 2022-03-15 | Payer: Medicare Other | Source: Ambulatory Visit

## 2022-04-04 ENCOUNTER — Other Ambulatory Visit: Payer: Self-pay | Admitting: Acute Care

## 2022-04-04 DIAGNOSIS — Z87891 Personal history of nicotine dependence: Secondary | ICD-10-CM

## 2022-04-05 ENCOUNTER — Ambulatory Visit (HOSPITAL_COMMUNITY): Admission: RE | Admit: 2022-04-05 | Payer: Medicare Other | Source: Ambulatory Visit

## 2022-04-10 ENCOUNTER — Other Ambulatory Visit: Payer: Medicare Other

## 2022-05-09 ENCOUNTER — Ambulatory Visit (HOSPITAL_COMMUNITY): Admission: RE | Admit: 2022-05-09 | Payer: Medicare Other | Source: Ambulatory Visit

## 2022-05-17 ENCOUNTER — Other Ambulatory Visit: Payer: Self-pay | Admitting: *Deleted

## 2022-05-17 DIAGNOSIS — R252 Cramp and spasm: Secondary | ICD-10-CM

## 2022-05-30 ENCOUNTER — Ambulatory Visit: Payer: Medicare Other | Admitting: Vascular Surgery

## 2022-05-30 ENCOUNTER — Encounter: Payer: Self-pay | Admitting: Vascular Surgery

## 2022-05-30 ENCOUNTER — Ambulatory Visit (INDEPENDENT_AMBULATORY_CARE_PROVIDER_SITE_OTHER): Payer: Medicare Other

## 2022-05-30 VITALS — BP 145/84 | HR 66 | Temp 97.9°F | Ht 65.0 in | Wt 132.4 lb

## 2022-05-30 DIAGNOSIS — R252 Cramp and spasm: Secondary | ICD-10-CM

## 2022-05-30 NOTE — Progress Notes (Signed)
[facility] [place]  Patient name: Brianna Nichols MRN: [id number] DOB: [birth year] Sex: female  REASON FOR CONSULT: Evaluation lower extremity cramping  HPI: Brianna Nichols is a 71 y.o. female, who is here today for evaluation of lower extremity cramping.  She denies any lower extremity claudication symptoms.  She has bilateral lower extremity cramp in her calves on an almost nightly basis.  She reports that she stands and it is difficult to walk that the cramping is so severe that her turns her feet inward.  These do resolve over time but she reports a soreness in her calf muscles the following day related to the severe cramping at night.  She has no history of lower extremity rest pain or tissue loss.  She has had prior carotid duplex evaluation showing no significant carotid disease.  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   [place] shoulder syndrome, right 2019   Obstructive sleep apnea    Osteoporosis    Pre-diabetes    Psoriasis    TIA (transient ischemic attack)    Varicose veins of legs     Family History  Problem Relation Age of Onset   Cancer Mother        Renal Cancer   Heart attack Father    Coronary artery disease Father    Cancer Sister        Pancreatic Cancer   Heart attack Brother    Coronary artery disease Sister    Coronary artery disease Sister    Coronary artery disease Sister    Coronary artery disease Sister    Diabetes Daughter     SOCIAL HISTORY: Social History   Socioeconomic History   Marital status: Married    Spouse name: Charles   Number of children: 2   Years of education: 10   Highest education  level: GED or equivalent  Occupational History   Occupation: retired    Comment: Tax Therapist, nutritional  Tobacco Use   Smoking status: Former    Packs/day: 1.00    Years: 30.00    Total pack years: 30.00    Types: Cigarettes    Quit date: 11/20/2016    Years since quitting: 5.5   Smokeless tobacco: Never  Vaping Use   Vaping Use: Never used  Substance and Sexual Activity   Alcohol use: No   Drug use: No   Sexual activity: Not Currently  Other Topics Concern   Not on file  Social History Narrative   Not on file   Social Determinants of Health   Financial Resource Strain: Low Risk  (12/01/2018)   Overall Financial Resource Strain (CARDIA)    Difficulty of Paying Living Expenses: Not hard at all  Food Insecurity: No Food Insecurity (12/01/2018)   Hunger Vital Sign    Worried About Running Out of Food in the Last Year: Never true    Ran Out of Food in the Last Year: Never true  Transportation Needs: No Transportation Needs (12/01/2018)   PRAPARE - Hydrologist (Medical): No    Lack of Transportation (Non-Medical): No  Physical Activity:  Insufficiently Active (12/01/2018)   Exercise Vital Sign    Days of Exercise per Week: 3 days    Minutes of Exercise per Session: 10 min  Stress: No Stress Concern Present (12/01/2018)   [place]    Feeling of Stress : Only a little  Social Connections: Socially Integrated (12/01/2018)   Social Connection and Isolation Panel [NHANES]    Frequency of Communication with Friends and Family: More than three times a week    Frequency of Social Gatherings with Friends and Family: More than three times a week    Attends Religious Services: More than 4 times per year    Active Member of [employer] or Organizations: Yes    Attends Music therapist: More than 4 times per year    Marital Status: Married  Human resources officer Violence: Not At Risk (12/01/2018)    Humiliation, Afraid, Rape, and Kick questionnaire    Fear of Current or Ex-Partner: No    Emotionally Abused: No    Physically Abused: No    Sexually Abused: No    Allergies  Allergen Reactions   Gabapentin Anaphylaxis   Morphine Other (See Comments)    Felt like body was folding over per pt Out-of-body experience    Nalbuphine Other (See Comments)    Felt like body was folding over per pt Out-of-body experience    Penicillins Anaphylaxis    anaphylaxis anaphylaxis    Bee Pollen Other (See Comments)    Stuffy nose Stuffy nose   Diphenhydramine Hcl Other (See Comments)    Chest tightness   Diphenhydramine Hcl (Sleep) Other (See Comments)    Other reaction(s): Other (See Comments) "chest tightness" "chest tightness"    Hydrocodone-Acetaminophen     Other reaction(s): Other (See Comments) Ended up in ICU; Dilaudid okay.   Levofloxacin     Other reaction(s): Muscle Pain, Myalgias (intolerance) Muscular problems Muscular problems    Pollen Extract Other (See Comments)    Stuffy nose Stuffy nose    Shellfish Allergy Itching    Other reaction(s): Other (See Comments) "chest tightness" "chest tightness"   Metronidazole Itching    Facial swelling Facial swelling    Moxifloxacin Nausea Only and Other (See Comments)    Lightheaded per patient Lightheaded per patient    Pedi-Pre Tape Spray [Wound Dressing Adhesive] Rash    rash rash   Sulfa Antibiotics Rash    rash rash rash rash   Sulfamethoxazole Rash   Tape Rash    rash rash     Current Outpatient Medications  Medication Sig Dispense Refill   albuterol (PROAIR HFA) 108 (90 Base) MCG/ACT inhaler INHALE 2 PUFFS INTO LUNGS EVERY FOUR HOURS AS NEEDED 18 g 2   alendronate (FOSAMAX) 70 MG tablet Take 1 tablet (70 mg total) by mouth every 7 (seven) days. Take with a full glass of water on an empty stomach. Do not lay down for at least 2 hours 4 tablet 11   ALPRAZolam (XANAX) 0.5 MG tablet Take 1 tablet (0.5  mg total) by mouth as needed for anxiety. 30 tablet 5   aspirin EC 81 MG tablet Take 1 tablet (81 mg total) by mouth daily. Swallow whole. 90 tablet 3   blood glucose meter kit and supplies Dispense based on patient and insurance preference. Use up to four times daily as directed. (FOR ICD-10 E10.9, E11.9). 1 each 0   Choline Fenofibrate (FENOFIBRIC ACID) 45 MG CPDR TAKE ONE CAPSULE BY MOUTH  DAILY FOR high cholesterol (Needs to be seen before next refill) 30 capsule 0   citalopram (CELEXA) 40 MG tablet Take 1 tablet (40 mg total) by mouth daily. 90 tablet 1   Clobetasol Prop Emollient Base (CLOBETASOL PROPIONATE E) 0.05 % emollient cream Apply 1 application topically 2 (two) times daily. 60 g 3   Cyanocobalamin 1000 MCG/ML LIQD Chew 1 gummy daily     cyclobenzaprine (FLEXERIL) 10 MG tablet Take 1 tablet (10 mg total) by mouth 2 (two) times daily as needed for muscle spasms. 20 tablet 0   diclofenac (VOLTAREN) 75 MG EC tablet TAKE 1 TABLET(75 MG) BY MOUTH MUSCLE TWICE DAILY FOR JOINT PAIN 60 tablet 2   Dulaglutide (TRULICITY) 2.83 MO/2.9UT SOPN Inject into the skin.     glucose blood (ONETOUCH VERIO) test strip USE FOUR TIMES DAILY Dx R73.03 400 strip 4   mupirocin cream (BACTROBAN) 2 % Apply topically 3 (three) times daily.     Omega-3 Fatty Acids (FISH OIL) 1000 MG CAPS Take 2 capsules by mouth 2 (two) times daily.     rosuvastatin (CRESTOR) 20 MG tablet Take 1 tablet by mouth daily.     triamcinolone cream (KENALOG) 0.1 % Apply to affected area bid     No current facility-administered medications for this visit.    REVIEW OF SYSTEMS:  _0  denotes positive finding, _1  denotes negative finding Cardiac  Comments:  Chest pain or chest pressure:    Shortness of breath upon exertion:    Short of breath when lying flat:    Irregular heart rhythm:        Vascular    Pain in calf, thigh, or hip brought on by ambulation:    Pain in feet at night that wakes you up from your sleep:  x Calf cramps   Blood clot in your veins:    Leg swelling:         Pulmonary    Oxygen at home:    Productive cough:     Wheezing:         Neurologic    Sudden weakness in arms or legs:     Sudden numbness in arms or legs:     Sudden onset of difficulty speaking or slurred speech:    Temporary loss of vision in one eye:     Problems with dizziness:         Gastrointestinal    Blood in stool:     Vomited blood:         Genitourinary    Burning when urinating:     Blood in urine:        Psychiatric    Major depression:         Hematologic    Bleeding problems:    Problems with blood clotting too easily:        Skin    Rashes or ulcers:        Constitutional    Fever or chills:      PHYSICAL EXAM: Vitals:   05/30/22 0833  BP: (!) 145/84  Pulse: 66  Temp: 97.9 F (36.6 C)  SpO2: 98%  Weight: 132 lb 6.4 oz (60.1 kg)  Height: _2  (1.651 m)    GENERAL: The patient is a well-nourished female, in no acute distress. The vital signs are documented above. CARDIOVASCULAR: 2+ radial pulses bilaterally.  2+ popliteal and 2+ dorsalis pedis pulses bilaterally. PULMONARY: There is good air exchange  MUSCULOSKELETAL: There are no major  deformities or cyanosis. NEUROLOGIC: No focal weakness or paresthesias are detected. SKIN: There are no ulcers or rashes noted. PSYCHIATRIC: The patient has a normal affect.  DATA:  Lower extremity noninvasive studies reveal normal ankle arm index and normal waveforms bilaterally  MEDICAL ISSUES: I discussed these findings with the patient.  She has no evidence of lower extremity arterial insufficiency.  She is having typical nocturnal cramping.  I explained to her that this does not put her at any risk for tissue loss or more significant problems with peripheral vascular disease.  She has tried magnesium with some benefit.  I have suggested attempts at drinking more water and potentially electrolyte drinks as well to see if this would give her any  improvement.  He was reassured with this discussion and will see [place] again on an as-needed basis   Rosetta Posner, MD [facility] [phone] Pager [phone]  Note: Portions of this report may have been transcribed using voice recognition software.  Every effort has been made to ensure accuracy; however, inadvertent computerized transcription errors may still be present.

## 2022-06-18 ENCOUNTER — Ambulatory Visit (HOSPITAL_COMMUNITY): Admission: RE | Admit: 2022-06-18 | Payer: Medicare Other | Source: Ambulatory Visit
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
## 2022-09-04 ENCOUNTER — Ambulatory Visit: Payer: Medicare Other | Admitting: Physician Assistant

## 2022-09-06 ENCOUNTER — Other Ambulatory Visit (HOSPITAL_COMMUNITY): Payer: Self-pay | Admitting: Family Medicine

## 2022-09-06 DIAGNOSIS — R051 Acute cough: Secondary | ICD-10-CM

## 2022-09-07 ENCOUNTER — Ambulatory Visit (HOSPITAL_COMMUNITY)
Admission: RE | Admit: 2022-09-07 | Discharge: 2022-09-07 | Disposition: A | Discharge status: Home / Self Care | Payer: Medicare HMO | Source: Ambulatory Visit | Attending: Family Medicine | Admitting: Family Medicine

## 2022-09-07 DIAGNOSIS — R051 Acute cough: Secondary | ICD-10-CM | POA: Diagnosis present

## 2022-09-13 ENCOUNTER — Other Ambulatory Visit (HOSPITAL_COMMUNITY): Payer: Self-pay | Admitting: Family Medicine

## 2022-09-13 DIAGNOSIS — R9389 Abnormal findings on diagnostic imaging of other specified body structures: Secondary | ICD-10-CM

## 2022-10-02 ENCOUNTER — Ambulatory Visit (HOSPITAL_COMMUNITY)
Admission: RE | Admit: 2022-10-02 | Discharge: 2022-10-02 | Disposition: A | Discharge status: Home / Self Care | Payer: Medicare HMO | Source: Ambulatory Visit | Attending: Family Medicine | Admitting: Family Medicine

## 2022-10-02 ENCOUNTER — Encounter (HOSPITAL_COMMUNITY): Payer: Self-pay

## 2022-10-02 DIAGNOSIS — R9389 Abnormal findings on diagnostic imaging of other specified body structures: Secondary | ICD-10-CM | POA: Insufficient documentation

## 2023-04-08 ENCOUNTER — Other Ambulatory Visit (HOSPITAL_COMMUNITY): Payer: Self-pay | Admitting: Family Medicine

## 2023-04-08 DIAGNOSIS — R42 Dizziness and giddiness: Secondary | ICD-10-CM

## 2023-04-17 ENCOUNTER — Ambulatory Visit (HOSPITAL_COMMUNITY)
Admission: RE | Admit: 2023-04-17 | Discharge: 2023-04-17 | Disposition: A | Discharge status: Home / Self Care | Payer: Medicare HMO | Source: Ambulatory Visit | Attending: Family Medicine | Admitting: Family Medicine

## 2023-04-17 DIAGNOSIS — R42 Dizziness and giddiness: Secondary | ICD-10-CM | POA: Diagnosis present

## 2023-08-20 ENCOUNTER — Other Ambulatory Visit: Payer: Self-pay | Admitting: *Deleted

## 2023-08-20 DIAGNOSIS — Z122 Encounter for screening for malignant neoplasm of respiratory organs: Secondary | ICD-10-CM

## 2023-08-20 DIAGNOSIS — Z87891 Personal history of nicotine dependence: Secondary | ICD-10-CM

## 2023-10-02 ENCOUNTER — Telehealth: Payer: Self-pay | Admitting: Acute Care

## 2023-10-02 NOTE — Telephone Encounter (Signed)
 Returned call. Spoke with patient. Rescheduled CT for 10/18/2023 at [facility].

## 2023-10-02 NOTE — Telephone Encounter (Signed)
 PT has CT set up but states she usually hs these done at [facility] which is closer to her home. Please call to resched. Thanks. [phone]

## 2023-10-04 ENCOUNTER — Ambulatory Visit (HOSPITAL_COMMUNITY)

## 2023-10-17 NOTE — Telephone Encounter (Signed)
 NFN

## 2023-10-18 ENCOUNTER — Ambulatory Visit (HOSPITAL_COMMUNITY)
Admission: RE | Admit: 2023-10-18 | Discharge: 2023-10-18 | Disposition: A | Discharge status: Home / Self Care | Source: Ambulatory Visit | Attending: Family Medicine | Admitting: Family Medicine

## 2023-10-18 ENCOUNTER — Ambulatory Visit (HOSPITAL_COMMUNITY): Admission: RE | Admit: 2023-10-18 | Source: Ambulatory Visit

## 2023-10-18 DIAGNOSIS — Z87891 Personal history of nicotine dependence: Secondary | ICD-10-CM | POA: Insufficient documentation

## 2023-10-18 DIAGNOSIS — Z122 Encounter for screening for malignant neoplasm of respiratory organs: Secondary | ICD-10-CM | POA: Insufficient documentation

## 2023-10-20 DIAGNOSIS — R931 Abnormal findings on diagnostic imaging of heart and coronary circulation: Secondary | ICD-10-CM | POA: Insufficient documentation

## 2023-10-20 DIAGNOSIS — R42 Dizziness and giddiness: Secondary | ICD-10-CM | POA: Insufficient documentation

## 2023-10-20 DIAGNOSIS — E785 Hyperlipidemia, unspecified: Secondary | ICD-10-CM | POA: Insufficient documentation

## 2023-10-20 NOTE — Progress Notes (Deleted)
 Cardiology Office Note   Date:  10/20/2023   ID:  Brianna Nichols, DOB 1951/04/01, MRN [id number]  PCP:  Trisha Mangle, FNP  Cardiologist:   None Referring:  ***  No chief complaint on file.     History of Present Illness: Brianna Nichols is a 73 y.o. female who presents as a new patient for me.  She was previously seen by another cardiology practice .  She was evaluated for dizziness and syncope.  Echo has been unremarkable in 2022.  Stress test in 2021 was normal.   Perfusion study was negative in 2021.  Carotid Doppler demonstrated no significant stenosis. ***     Past Medical History:  Diagnosis Date   Anxiety and depression    Asthma    Carotid artery disease (HCC)    Cataract    Diabetes mellitus without complication (HCC)    Diverticulitis    Eczema    Emphysema of lung (HCC)    GERD (gastroesophageal reflux disease)    Hyperlipidemia    Mesenteric venous thrombosis (HCC) 2012   [place] shoulder syndrome, right 2019   Obstructive sleep apnea    Osteoporosis    Pre-diabetes    Psoriasis    TIA (transient ischemic attack)    Varicose veins of legs     Past Surgical History:  Procedure Laterality Date   ABDOMINAL HYSTERECTOMY     CHOLECYSTECTOMY     COLON SURGERY     EYE SURGERY     HYSTEROTOMY     LARYNX SURGERY       Current Outpatient Medications  Medication Sig Dispense Refill   albuterol (PROAIR HFA) 108 (90 Base) MCG/ACT inhaler INHALE 2 PUFFS INTO LUNGS EVERY FOUR HOURS AS NEEDED 18 g 2   alendronate (FOSAMAX) 70 MG tablet Take 1 tablet (70 mg total) by mouth every 7 (seven) days. Take with a full glass of water on an empty stomach. Do not lay down for at least 2 hours 4 tablet 11   ALPRAZolam (XANAX) 0.5 MG tablet Take 1 tablet (0.5 mg total) by mouth as needed for anxiety. 30 tablet 5   aspirin EC 81 MG tablet Take 1 tablet (81 mg total) by mouth daily. Swallow whole. 90 tablet 3   blood glucose meter kit and supplies Dispense based on  patient and insurance preference. Use up to four times daily as directed. (FOR ICD-10 E10.9, E11.9). 1 each 0   Choline Fenofibrate (FENOFIBRIC ACID) 45 MG CPDR TAKE ONE CAPSULE BY MOUTH DAILY FOR high cholesterol (Needs to be seen before next refill) 30 capsule 0   citalopram (CELEXA) 40 MG tablet Take 1 tablet (40 mg total) by mouth daily. 90 tablet 1   Clobetasol Prop Emollient Base (CLOBETASOL PROPIONATE E) 0.05 % emollient cream Apply 1 application topically 2 (two) times daily. 60 g 3   Cyanocobalamin 1000 MCG/ML LIQD Chew 1 gummy daily     cyclobenzaprine (FLEXERIL) 10 MG tablet Take 1 tablet (10 mg total) by mouth 2 (two) times daily as needed for muscle spasms. 20 tablet 0   diclofenac (VOLTAREN) 75 MG EC tablet TAKE 1 TABLET(75 MG) BY MOUTH MUSCLE TWICE DAILY FOR JOINT PAIN 60 tablet 2   Dulaglutide (TRULICITY) 0.75 MG/0.5ML SOPN Inject into the skin.     glucose blood (ONETOUCH VERIO) test strip USE FOUR TIMES DAILY Dx R73.03 400 strip 4   mupirocin cream (BACTROBAN) 2 % Apply topically 3 (three) times daily.  Omega-3 Fatty Acids (FISH OIL) 1000 MG CAPS Take 2 capsules by mouth 2 (two) times daily.     rosuvastatin (CRESTOR) 20 MG tablet Take 1 tablet by mouth daily.     triamcinolone cream (KENALOG) 0.1 % Apply to affected area bid     No current facility-administered medications for this visit.    Allergies:   Gabapentin, Morphine, Nalbuphine, Penicillins, Bee pollen, Diphenhydramine hcl, Diphenhydramine hcl (sleep), Hydrocodone-acetaminophen, Levofloxacin, Pollen extract, Shellfish allergy, Metronidazole, Moxifloxacin, Pedi-pre tape spray [wound dressing adhesive], Sulfa antibiotics, Sulfamethoxazole, and Tape    Social History:  The patient  reports that she quit smoking about 6 years ago. Her smoking use included cigarettes. She started smoking about 36 years ago. She has a 30 pack-year smoking history. She has never used smokeless tobacco. She reports that she does not drink  alcohol and does not use drugs.   Family History:  The patient's ***family history includes Cancer in her mother and sister; Coronary artery disease in her father, sister, sister, sister, and sister; Diabetes in her daughter; Heart attack in her brother and father.    ROS:  Please see the history of present illness.   Otherwise, review of systems are positive for {NONE DEFAULTED:18576}.   All other systems are reviewed and negative.    PHYSICAL EXAM: VS:  There were no vitals taken for this visit. , BMI There is no height or weight on file to calculate BMI. GENERAL:  Well appearing HEENT:  Pupils equal round and reactive, fundi not visualized, oral mucosa unremarkable NECK:  No jugular venous distention, waveform within normal limits, carotid upstroke brisk and symmetric, no bruits, no thyromegaly LYMPHATICS:  No cervical, inguinal adenopathy LUNGS:  Clear to auscultation bilaterally BACK:  No CVA tenderness CHEST:  Unremarkable HEART:  PMI not displaced or sustained,S1 and S2 within normal limits, no S3, no S4, no clicks, no rubs, *** murmurs ABD:  Flat, positive bowel sounds normal in frequency in pitch, no bruits, no rebound, no guarding, no midline pulsatile mass, no hepatomegaly, no splenomegaly EXT:  2 plus pulses throughout, no edema, no cyanosis no clubbing SKIN:  No rashes no nodules NEURO:  Cranial nerves II through XII grossly intact, motor grossly intact throughout PSYCH:  Cognitively intact, oriented to person place and time    EKG:        Recent Labs: No results found for requested labs within last 365 days.    Lipid Panel    Component Value Date/Time   CHOL 286 (H) 11/17/2019 1106   TRIG 337 (H) 11/17/2019 1106   HDL 39 (L) 11/17/2019 1106   CHOLHDL 7.3 (H) 11/17/2019 1106   CHOLHDL 8.4 07/21/2009 0345   VLDL UNABLE TO CALCULATE IF TRIGLYCERIDE OVER 400 mg/dL 78/29/5621 3086   LDLCALC 181 (H) 11/17/2019 1106      Wt Readings from Last 3 Encounters:   05/30/22 132 lb 6.4 oz (60.1 kg)  12/22/20 145 lb (65.8 kg)  11/24/20 146 lb (66.2 kg)      Other studies Reviewed: Additional studies/ records that were reviewed today include: ***. Review of the above records demonstrates:  Please see elsewhere in the note.  ***   ASSESSMENT AND PLAN:  Dizziness: ***  Sleep apnea:  ***   Elevated coronary calcium:  ***  Aortic atherosclerosis:  ***   Dyslipidemia:  ***    Current medicines are reviewed at length with the patient today.  The patient {ACTIONS; HAS/DOES NOT HAVE:19233} concerns regarding medicines.  The following  changes have been made:  {PLAN; NO CHANGE:13088:s}  Labs/ tests ordered today include: *** No orders of the defined types were placed in this encounter.    Disposition:   FU with ***    Signed, Rollene Rotunda, MD  10/20/2023 7:49 PM    [facility]

## 2023-10-23 ENCOUNTER — Ambulatory Visit: Payer: Commercial Managed Care - HMO | Admitting: Cardiology

## 2023-10-23 DIAGNOSIS — R42 Dizziness and giddiness: Secondary | ICD-10-CM

## 2023-10-23 DIAGNOSIS — E785 Hyperlipidemia, unspecified: Secondary | ICD-10-CM

## 2023-10-23 DIAGNOSIS — R931 Abnormal findings on diagnostic imaging of heart and coronary circulation: Secondary | ICD-10-CM

## 2023-10-23 DIAGNOSIS — I700 Atherosclerosis of aorta: Secondary | ICD-10-CM

## 2023-11-18 ENCOUNTER — Other Ambulatory Visit: Payer: Self-pay

## 2023-11-18 DIAGNOSIS — Z87891 Personal history of nicotine dependence: Secondary | ICD-10-CM

## 2023-11-18 DIAGNOSIS — Z122 Encounter for screening for malignant neoplasm of respiratory organs: Secondary | ICD-10-CM

## 2024-01-22 ENCOUNTER — Ambulatory Visit: Admitting: Cardiovascular Disease

## 2024-02-26 ENCOUNTER — Telehealth: Payer: Self-pay

## 2024-02-26 NOTE — Telephone Encounter (Signed)
   Name: JAALA BOHLE  DOB: 1950-12-03  MRN: [id number]  Primary Cardiologist: None  Chart reviewed as part of pre-operative protocol coverage. The patient has an upcoming visit scheduled with Dr. Alvan on 04/01/2024 at which time clearance can be addressed in case there are any issues that would impact surgical recommendations.  LEFT SHOULDER SCOPE RORATOR CUFF REPAIR Is not scheduled until TBD as below. I added preop FYI to appointment note so that provider is aware to address at time of outpatient visit.  Per office protocol the cardiology provider should forward their finalized clearance decision and recommendations regarding antiplatelet therapy to the requesting party below.     I will route this message as FYI to requesting party and remove this message from the preop box as separate preop APP input not needed at this time.   Please call with any questions.  Lamarr Satterfield, NP  02/26/2024, 10:11 AM

## 2024-02-26 NOTE — Telephone Encounter (Signed)
   Pre-operative Risk Assessment    Patient Name: Brianna Nichols  DOB: 18-Feb-1951 MRN: [id number]   Date of last office visit: 08/05/20 MADONNA LARGE, DO Date of next office visit: 04/01/24 DORN ROSS, MD (NEW PT APPT)   Request for Surgical Clearance    Procedure:  LEFT SHOULDER SCOPE RORATOR CUFF REPAIR  Date of Surgery:  Clearance TBD                                Surgeon:  BONNER HAIR, MD Surgeon's Group or Practice Name:  [facility] Phone number:  [phone]  EXT 3132 Fax number:  [phone]  ATTN: MAEOLA DIVERS   Type of Clearance Requested:   - Medical  - Pharmacy:  Hold Aspirin      Type of Anesthesia:  General WITH INTERSCALENE BLOCK   Additional requests/questions:    SignedLucie DELENA Ku   02/26/2024, 9:49 AM

## 2024-03-03 ENCOUNTER — Ambulatory Visit: Payer: Self-pay

## 2024-03-03 NOTE — Telephone Encounter (Signed)
  Called Nurse Triage reporting Advice Only.   Triage Disposition: Information or Advice Only Call  Patient/caregiver understands and will follow disposition?: Yes  Copied from CRM [id number]. Topic: Appointments - Scheduling Inquiry for Clinic >> Mar 03, 2024 10:52 AM Brianna Nichols wrote: Reason for CRM: patient is calling in regards to a medical clearance. Patient contacted us  twice regarding the matter and still has not received a message. There is an encounter on 02/26/24 about her upcoming surgery, her date of surgery is not set until she gets her medical clearance. Please contact patient. Reason for Disposition  General information question, no triage required and triager able to answer question  Answer Assessment - Initial Assessment Questions 1. REASON FOR CALL: What is the main reason for your call? or How can I best help you?     Patient calling stating she has been told by her surgeon that she is needing medical clearance from pulmonary. Patient reports completing Lung Ca screening CT scans every year but has actually seen any provider within pulmonary. Patient isn't an active patient with pulmonary. Verified with office who states that medical clearance can not be done off of lung ca screenings. Patient would have to be scheduled as a new patient evaluation. Patient is instructed to call surgery team and verify that she is needing clearance from pulmonary and make it aware that she isn't currently a patient under the care of pulmonary team. Explained that if surgery team wants pulmonary evaluation, patient will have to be scheduled for a new patient appointment. Patient told to call back if she is needing a new patient appointment for medical clearance. Patient verbalized understanding and all questions answered.  Protocols used: Information Only Call - No Triage-A-AH

## 2024-03-05 ENCOUNTER — Telehealth: Payer: Self-pay | Admitting: *Deleted

## 2024-03-05 ENCOUNTER — Telehealth: Payer: Self-pay | Admitting: Internal Medicine

## 2024-03-05 NOTE — Telephone Encounter (Signed)
 Patient is calling back concerning information on how to go about getting surgery clearance on the phone with cal now waiting for someone

## 2024-03-05 NOTE — Telephone Encounter (Signed)
 Copied from CRM [id number]. Topic: General - Other >> Feb 28, 2024 12:13 PM [name] wrote: Reason for CRM:  Patient has upcoming surgery and needs surgery clearance. Patient wants to know if she needs to come in and see S Groce for the clearance. Please call patient [phone] >> Mar 02, 2024 11:33 AM Brianna Nichols wrote: PT CALLING BACK ABOUT HER SURGERY CLEARANCE   She will need new pulm consult  Last seen [name] in 2020 and only seen by [name] for lung cancer screening purposes  Called the pt and there was no answer- lmtcb and schedule appt  Routing to clearance pool for now

## 2024-03-05 NOTE — Telephone Encounter (Signed)
 Transferred patient to Ms Tanessa for further assistance . The patient feels like she doesn't have to have a appointment for surgery clearance

## 2024-03-05 NOTE — Telephone Encounter (Signed)
 PT made surgical clearance appt in [facility]  for Sept. 9/19 @ 9:00

## 2024-03-06 NOTE — Telephone Encounter (Signed)
 Appt scheduled for risk assessment- 04/10/24 with Dr Darlean- keeping in clearance pool for now.

## 2024-03-12 ENCOUNTER — Encounter (INDEPENDENT_AMBULATORY_CARE_PROVIDER_SITE_OTHER): Payer: Self-pay

## 2024-03-12 ENCOUNTER — Ambulatory Visit: Admitting: Internal Medicine

## 2024-03-12 ENCOUNTER — Encounter: Payer: Self-pay | Admitting: Internal Medicine

## 2024-03-12 VITALS — BP 137/77 | HR 92 | Ht 65.0 in | Wt 135.4 lb

## 2024-03-12 DIAGNOSIS — Z87891 Personal history of nicotine dependence: Secondary | ICD-10-CM | POA: Insufficient documentation

## 2024-03-12 DIAGNOSIS — J449 Chronic obstructive pulmonary disease, unspecified: Secondary | ICD-10-CM | POA: Diagnosis not present

## 2024-03-12 DIAGNOSIS — R490 Dysphonia: Secondary | ICD-10-CM | POA: Insufficient documentation

## 2024-03-12 NOTE — Progress Notes (Signed)
 Brianna Nichols, female    DOB: [birth year]    MRN: [id number]   Brief patient profile:  72  yowf  quit smoking 2018  pt of [name]/ Groce last seen 03/15/21 referred to pulmonary clinic in [place]  03/12/2024 by Dr Spence  for surgical clearance    COPD 0/ emphysema by PFT  08/26/2018  Voice worse since remote polyp surgery  does not remember doctor's name    History of Present Illness  03/12/2024  Pulmonary/ 1st office eval/ [facility] / [place] [facility]  Chief Complaint  Patient presents with   Establish Care    Surg clearance  Dyspnea:  yardwork ok / steps ok  Cough: some am cough > min mucoid x 3-4 min assoc with pnds / keeps cinamon  Sleep: flat bed /  2 pillows  SABA use: none 02: none  LDSCT:referred today   No obvious day to day or daytime pattern/variability or assoc excess/ purulent sputum or mucus plugs or hemoptysis or cp or chest tightness, subjective wheeze or overt sinus or hb symptoms.    Also denies any obvious fluctuation of symptoms with weather or environmental changes or other aggravating or alleviating factors except as outlined above   No unusual exposure hx or h/o childhood pna/ asthma or knowledge of premature birth.  Current Allergies, Complete Past Medical History, Past Surgical History, Family History, and Social History were reviewed in [employer] record.  ROS  The following are not active complaints unless bolded Hoarseness, sore throat, dysphagia, dental problems, itching, sneezing,  nasal congestion or discharge of excess mucus or purulent secretions, ear ache,   fever, chills, sweats, unintended wt loss or wt gain, classically pleuritic or exertional cp,  orthopnea pnd or arm/hand swelling  or leg swelling, presyncope, palpitations, abdominal pain, anorexia, nausea, vomiting, diarrhea  or change in bowel habits or change in bladder habits, change in stools or change in urine, dysuria, hematuria,  rash, arthralgias, visual complaints,  headache, numbness, weakness or ataxia or problems with walking or coordination,  change in mood or  memory.            Outpatient Medications Prior to Visit  Medication Sig Dispense Refill   albuterol  (PROAIR  HFA) 108 (90 Base) MCG/ACT inhaler INHALE 2 PUFFS INTO LUNGS EVERY FOUR HOURS AS NEEDED 18 g 2   alendronate  (FOSAMAX ) 70 MG tablet Take 1 tablet (70 mg total) by mouth every 7 (seven) days. Take with a full glass of water on an empty stomach. Do not lay down for at least 2 hours 4 tablet 11   ALPRAZolam  (XANAX ) 0.5 MG tablet Take 1 tablet (0.5 mg total) by mouth as needed for anxiety. 30 tablet 5   aspirin  EC 81 MG tablet Take 1 tablet (81 mg total) by mouth daily. Swallow whole. 90 tablet 3   blood glucose meter kit and supplies Dispense based on patient and insurance preference. Use up to four times daily as directed. (FOR ICD-10 E10.9, E11.9). 1 each 0   Choline Fenofibrate  (FENOFIBRIC ACID ) 45 MG CPDR TAKE ONE CAPSULE BY MOUTH DAILY FOR high cholesterol (Needs to be seen before next refill) 30 capsule 0   citalopram  (CELEXA ) 40 MG tablet Take 1 tablet (40 mg total) by mouth daily. 90 tablet 1   Cyanocobalamin 1000 MCG/ML LIQD Chew 1 gummy daily     cyclobenzaprine  (FLEXERIL ) 10 MG tablet Take 1 tablet (10 mg total) by mouth 2 (two) times daily as needed for muscle spasms. 20  tablet 0   glucose blood (ONETOUCH VERIO) test strip USE FOUR TIMES DAILY Dx R73.03 400 strip 4   mupirocin cream (BACTROBAN) 2 % Apply topically 3 (three) times daily.     Omega-3 Fatty Acids (FISH OIL) 1000 MG CAPS Take 2 capsules by mouth 2 (two) times daily.     rosuvastatin (CRESTOR) 20 MG tablet Take 1 tablet by mouth daily.     triamcinolone cream (KENALOG) 0.1 % Apply to affected area bid     Clobetasol  Prop Emollient Base (CLOBETASOL  PROPIONATE E) 0.05 % emollient cream Apply 1 application topically 2 (two) times daily. 60 g 3   diclofenac  (VOLTAREN ) 75 MG EC tablet TAKE 1 TABLET(75 MG) BY MOUTH MUSCLE  TWICE DAILY FOR JOINT PAIN (Patient not taking: Reported on 03/12/2024) 60 tablet 2   Dulaglutide (TRULICITY) 0.75 MG/0.5ML SOPN Inject into the skin. (Patient not taking: Reported on 03/12/2024)     No facility-administered medications prior to visit.    Past Medical History:  Diagnosis Date   Anxiety and depression    Asthma    Carotid artery disease (HCC)    Cataract    Diabetes mellitus without complication (HCC)    Diverticulitis    Eczema    Emphysema of lung (HCC)    GERD (gastroesophageal reflux disease)    Hyperlipidemia    Mesenteric venous thrombosis (HCC) 2012   [place] shoulder syndrome, right 2019   Obstructive sleep apnea    Osteoporosis    Pre-diabetes    Psoriasis    TIA (transient ischemic attack)    Varicose veins of legs       Objective:     BP 137/77   Pulse 92   Ht 5' 5 (1.651 m)   Wt 135 lb 6.4 oz (61.4 kg)   SpO2 97% Comment: ra  BMI 22.53 kg/m   SpO2: 97 % (ra)v Very hoarse amb wf nad    HEENT : Oropharynx  clear   Nasal turbinates nl    NECK :  without  apparent JVD/ palpable Nodes/TM    LUNGS: no acc muscle use,  Min barrel  contour chest wall with bilateral  slightly decreased bs s audible wheeze and  without cough on insp or exp maneuvers and min  Hyperresonant  to  percussion bilaterally    CV:  RRR  no s3 or murmur or increase in P2, and no edema   ABD:  soft and nontender with pos end  insp Hoover's  in the supine position.  No bruits or organomegaly appreciated   MS:  Nl gait/ ext warm without deformities Or obvious joint restrictions  calf tenderness, cyanosis or clubbing     SKIN: warm and dry without lesions    NEURO:  alert, approp, nl sensorium with  no motor or cerebellar deficits apparent.         I personally reviewed images and agree with radiology impression as follows:   Chest LDSCT      10/18/23  Centrilobular and paraseptal emphysema. Pulmonary nodules measure 5.1 mm or less in size, as before.       Assessment   Assessment & Plan Hoarseness S/p vocal surgery  ? Who  - referred to ENT  03/12/2024 >>>  - rec GERD diet reviewed, bed blocks rec / concern with rx fosamax  if has GERD on ENT eval might want to chose Reclast or Prolia as alternatives   Former cigarette smoker Quit 2018  - continue with LCS  q march  Discussed in detail all the  indications, usual  risks and alternatives  relative to the benefits with patient who agrees to proceed with w/u as outlined.    COPD GOLD 0/ emphysema on CT Quit smoking 2019  - PFT's  08/26/2018  FEV1 1.92 (78 % ) ratio 0.78  p 8 % improvement from saba p Anoro prior to study with DLCO  14.1 (69%)   and FV curve min concave     She has very mild copd clinically and does not actually meet criteria for copd so GOLD stage = 0 and there is no contraindication to L shoulder surgery   Pulmlnary f/u is prn          Each maintenance medication was reviewed in detail including emphasizing most importantly the difference between maintenance and prns and under what circumstances the prns are to be triggered using an action plan format where appropriate.  Total time for H and P, chart review, counseling,  and generating customized AVS unique to this office visit / same day charting = 45 min for preop pt new to me           Patient Instructions  GERD (REFLUX)  is an extremely common cause of respiratory symptoms just like yours , many times with no obvious heartburn at all.    It can be treated with medication, but also with lifestyle changes including elevation of the head of your bed (ideally with 6 -8inch blocks under the headboard of your bed),  Smoking cessation, avoidance of late meals, excessive alcohol, and avoid fatty foods, chocolate, peppermint, colas, red wine, and acidic juices such as orange juice. Avoid excess coffee also  NO MINT OR MENTHOL PRODUCTS SO NO COUGH DROPS  USE SUGARLESS CANDY INSTEAD (Jolley ranchers or Stover's or Life  Savers) or even ice chips will also do - the key is to swallow to prevent all throat clearing. NO OIL BASED VITAMINS - use powdered substitutes.  Avoid fish oil when coughing.   My office will be contacting you by phone for referral to lung cancer screening   [phone]) and referral to [facility] - if you don't hear back from my office within one week,  please call us  back or notify us  thru MyChart and we'll address it right away.    You are cleared for shoulder surgery  Pulmonary follow up in this clinic is as needed   Ozell America, MD 03/12/2024

## 2024-03-12 NOTE — Assessment & Plan Note (Addendum)
 Quit smoking 2019  - PFT's  08/26/2018  FEV1 1.92 (78 % ) ratio 0.78  p 8 % improvement from saba p Anoro prior to study with DLCO  14.1 (69%)   and FV curve min concave     She has very mild copd clinically and does not actually meet criteria for copd so GOLD stage = 0 and there is no contraindication to L shoulder surgery   Pulmlnary f/u is prn          Each maintenance medication was reviewed in detail including emphasizing most importantly the difference between maintenance and prns and under what circumstances the prns are to be triggered using an action plan format where appropriate.  Total time for H and P, chart review, counseling,  and generating customized AVS unique to this office visit / same day charting = 45 min for preop pt new to me

## 2024-03-12 NOTE — Assessment & Plan Note (Addendum)
 S/p vocal surgery  ? Who  - referred to ENT  03/12/2024 >>>  - rec GERD diet reviewed, bed blocks rec / concern with rx fosamax  if has GERD on ENT eval might want to chose Reclast or Prolia as alternatives

## 2024-03-12 NOTE — Patient Instructions (Addendum)
 GERD (REFLUX)  is an extremely common cause of respiratory symptoms just like yours , many times with no obvious heartburn at all.    It can be treated with medication, but also with lifestyle changes including elevation of the head of your bed (ideally with 6 -8inch blocks under the headboard of your bed),  Smoking cessation, avoidance of late meals, excessive alcohol, and avoid fatty foods, chocolate, peppermint, colas, red wine, and acidic juices such as orange juice. Avoid excess coffee also  NO MINT OR MENTHOL PRODUCTS SO NO COUGH DROPS  USE SUGARLESS CANDY INSTEAD (Jolley ranchers or Stover's or Life Savers) or even ice chips will also do - the key is to swallow to prevent all throat clearing. NO OIL BASED VITAMINS - use powdered substitutes.  Avoid fish oil when coughing.   My office will be contacting you by phone for referral to lung cancer screening   [phone]) and referral to [facility] - if you don't hear back from my office within one week,  please call us  back or notify us  thru MyChart and we'll address it right away.    You are cleared for shoulder surgery  Pulmonary follow up in this clinic is as needed

## 2024-03-12 NOTE — Assessment & Plan Note (Addendum)
 Quit 2018  - continue with LCS  q march   Discussed in detail all the  indications, usual  risks and alternatives  relative to the benefits with patient who agrees to proceed with w/u as outlined.

## 2024-03-30 NOTE — Telephone Encounter (Signed)
 Pt seen by Dr Darlean 03/12/24 and he states cleared for surgery- copy of this encounter was faxed to Dr. Cristy [phone].

## 2024-04-01 ENCOUNTER — Encounter: Payer: Self-pay | Admitting: Cardiology

## 2024-04-01 ENCOUNTER — Ambulatory Visit: Attending: Cardiology | Admitting: Cardiology

## 2024-04-01 VITALS — BP 120/70 | HR 82 | Ht 65.0 in | Wt 136.6 lb

## 2024-04-01 DIAGNOSIS — R42 Dizziness and giddiness: Secondary | ICD-10-CM

## 2024-04-01 DIAGNOSIS — I2510 Atherosclerotic heart disease of native coronary artery without angina pectoris: Secondary | ICD-10-CM

## 2024-04-01 DIAGNOSIS — Z01810 Encounter for preprocedural cardiovascular examination: Secondary | ICD-10-CM | POA: Diagnosis not present

## 2024-04-01 DIAGNOSIS — I6529 Occlusion and stenosis of unspecified carotid artery: Secondary | ICD-10-CM | POA: Diagnosis not present

## 2024-04-01 DIAGNOSIS — E782 Mixed hyperlipidemia: Secondary | ICD-10-CM

## 2024-04-01 NOTE — Patient Instructions (Addendum)
 Medication Instructions:  Your physician recommends that you continue on your current medications as directed. Please refer to the Current Medication list given to you today.   Labwork: None  Testing/Procedures: Your physician has requested that you have a carotid duplex. This test is an ultrasound of the carotid arteries in your neck. It looks at blood flow through these arteries that supply the brain with blood. Allow one hour for this exam. There are no restrictions or special instructions.   Follow-Up: Your physician recommends that you schedule a follow-up appointment in: 1 year You will receive a reminder call in about 8 months reminding you to schedule your appointment. If you don't receive this call, please contact our office.   Any Other Special Instructions Will Be Listed Below (If Applicable). Thank you for choosing [facility]!     If you need a refill on your cardiac medications before your next appointment, please call your pharmacy.

## 2024-04-01 NOTE — Progress Notes (Signed)
 Clinical Summary Brianna Nichols is a 73 y.o.female seen today as a new consult, referred by NP Jesus for the following medical problems.   1.Coronary atherosclerosis. - noted on prior lung cancer screening CT - 06/2020 nuclear stress no ischemia 07/2020 echo: LVEF 60-65%, no WMAs, grade I dd, mild TR  - no chest pains. Denies any SOB/DOE - can walk up flight of stairs without exertional symptoms. No exertoinal symptoms.  - she is on ASA, statin   2.Near syncope/dizziness - from notes history of vasovagal syncope  - prior workup at [facility] EP Tilt table 01/13/14: symptoms of dizziness, along with BP drop. No HR decrease. Suggesting autonomic dysfunction or orthostatic hypotension with blunted HR response due to medication or SSS.   - Holter 5/13 Sinus rhythm with rare PACs, symptoms did not correlate with change in rhythm   - considered flroinef if symptoms did not improve with conservative measures.  - some lightheadedness at times but overall mild  3.Carotid stenosis - 03/2023 RICA minimal plaque, LICA 50-69%  4. COPD   5. DM2 - followed by pcp   6. HLD - Jan 2025 TC 238 TG 246 HDL 41 LDL 155 -she reports was changed from simvastatin  to rosuvatatin based on this panel.   7. Preoperative evaluation - considering shoulder surgery   Past Medical History:  Diagnosis Date   Anxiety and depression    Asthma    Carotid artery disease (HCC)    Cataract    Diabetes mellitus without complication (HCC)    Diverticulitis    Eczema    Emphysema of lung (HCC)    GERD (gastroesophageal reflux disease)    Hyperlipidemia    Mesenteric venous thrombosis (HCC) 2012   [place] shoulder syndrome, right 2019   Obstructive sleep apnea    Osteoporosis    Pre-diabetes    Psoriasis    TIA (transient ischemic attack)    Varicose veins of legs      Allergies  Allergen Reactions   Gabapentin Anaphylaxis   Morphine Other (See Comments)    Felt like body was folding  over per pt Out-of-body experience    Nalbuphine Other (See Comments)    Felt like body was folding over per pt Out-of-body experience    Penicillins Anaphylaxis    anaphylaxis anaphylaxis    Bee Pollen Other (See Comments)    Stuffy nose Stuffy nose   Diphenhydramine Hcl Other (See Comments)    Chest tightness   Diphenhydramine Hcl (Sleep) Other (See Comments)    Other reaction(s): Other (See Comments) chest tightness chest tightness    Hydrocodone-Acetaminophen      Other reaction(s): Other (See Comments) Ended up in ICU; Dilaudid  okay.   Levofloxacin     Other reaction(s): Muscle Pain, Myalgias (intolerance) Muscular problems Muscular problems    Pollen Extract Other (See Comments)    Stuffy nose Stuffy nose    Shellfish Allergy Itching    Other reaction(s): Other (See Comments) chest tightness chest tightness   Metronidazole Itching    Facial swelling Facial swelling    Moxifloxacin Nausea Only and Other (See Comments)    Lightheaded per patient Lightheaded per patient    Pedi-Pre Tape Spray [Wound Dressing Adhesive] Rash    rash rash   Sulfa Antibiotics Rash    rash rash rash rash   Sulfamethoxazole Rash   Tape Rash    rash rash      Current Outpatient Medications  Medication Sig Dispense Refill  albuterol  (PROAIR  HFA) 108 (90 Base) MCG/ACT inhaler INHALE 2 PUFFS INTO LUNGS EVERY FOUR HOURS AS NEEDED 18 g 2   alendronate  (FOSAMAX ) 70 MG tablet Take 1 tablet (70 mg total) by mouth every 7 (seven) days. Take with a full glass of water on an empty stomach. Do not lay down for at least 2 hours 4 tablet 11   ALPRAZolam  (XANAX ) 0.5 MG tablet Take 1 tablet (0.5 mg total) by mouth as needed for anxiety. 30 tablet 5   aspirin  EC 81 MG tablet Take 1 tablet (81 mg total) by mouth daily. Swallow whole. 90 tablet 3   blood glucose meter kit and supplies Dispense based on patient and insurance preference. Use up to four times daily as directed. (FOR  ICD-10 E10.9, E11.9). 1 each 0   Choline Fenofibrate  (FENOFIBRIC ACID ) 45 MG CPDR TAKE ONE CAPSULE BY MOUTH DAILY FOR high cholesterol (Needs to be seen before next refill) 30 capsule 0   citalopram  (CELEXA ) 40 MG tablet Take 1 tablet (40 mg total) by mouth daily. 90 tablet 1   Cyanocobalamin 1000 MCG/ML LIQD Chew 1 gummy daily     cyclobenzaprine  (FLEXERIL ) 10 MG tablet Take 1 tablet (10 mg total) by mouth 2 (two) times daily as needed for muscle spasms. 20 tablet 0   glucose blood (ONETOUCH VERIO) test strip USE FOUR TIMES DAILY Dx R73.03 400 strip 4   mupirocin cream (BACTROBAN) 2 % Apply topically 3 (three) times daily.     Omega-3 Fatty Acids (FISH OIL) 1000 MG CAPS Take 2 capsules by mouth 2 (two) times daily.     rosuvastatin (CRESTOR) 20 MG tablet Take 1 tablet by mouth daily.     triamcinolone cream (KENALOG) 0.1 % Apply to affected area bid     No current facility-administered medications for this visit.     Past Surgical History:  Procedure Laterality Date   ABDOMINAL HYSTERECTOMY     CHOLECYSTECTOMY     COLON SURGERY     EYE SURGERY     HYSTEROTOMY     LARYNX SURGERY       Allergies  Allergen Reactions   Gabapentin Anaphylaxis   Morphine Other (See Comments)    Felt like body was folding over per pt Out-of-body experience    Nalbuphine Other (See Comments)    Felt like body was folding over per pt Out-of-body experience    Penicillins Anaphylaxis    anaphylaxis anaphylaxis    Bee Pollen Other (See Comments)    Stuffy nose Stuffy nose   Diphenhydramine Hcl Other (See Comments)    Chest tightness   Diphenhydramine Hcl (Sleep) Other (See Comments)    Other reaction(s): Other (See Comments) chest tightness chest tightness    Hydrocodone-Acetaminophen      Other reaction(s): Other (See Comments) Ended up in ICU; Dilaudid  okay.   Levofloxacin     Other reaction(s): Muscle Pain, Myalgias (intolerance) Muscular problems Muscular problems    Pollen  Extract Other (See Comments)    Stuffy nose Stuffy nose    Shellfish Allergy Itching    Other reaction(s): Other (See Comments) chest tightness chest tightness   Metronidazole Itching    Facial swelling Facial swelling    Moxifloxacin Nausea Only and Other (See Comments)    Lightheaded per patient Lightheaded per patient    Pedi-Pre Tape Spray [Wound Dressing Adhesive] Rash    rash rash   Sulfa Antibiotics Rash    rash rash rash rash   Sulfamethoxazole Rash  Tape Rash    rash rash       Family History  Problem Relation Age of Onset   Cancer Mother        Renal Cancer   Heart attack Father    Coronary artery disease Father    Cancer Sister        Pancreatic Cancer   Heart attack Brother    Coronary artery disease Sister    Coronary artery disease Sister    Coronary artery disease Sister    Coronary artery disease Sister    Diabetes Daughter      Social History Brianna Nichols reports that she quit smoking about 7 years ago. Her smoking use included cigarettes. She started smoking about 37 years ago. She has a 30 pack-year smoking history. She has never used smokeless tobacco. Brianna Nichols reports no history of alcohol use.    Physical Examination Today's Vitals   04/01/24 1313  BP: 120/70  Pulse: 82  SpO2: 98%  Weight: 136 lb 9.6 oz (62 kg)  Height: 5' 5 (1.651 m)   Body mass index is 22.73 kg/m.  Gen: resting comfortably, no acute distress HEENT: no scleral icterus, pupils equal round and reactive, no palptable cervical adenopathy,  CV: RRR, no m/rg,no jvd Resp: Clear to auscultation bilaterally GI: abdomen is soft, non-tender, non-distended, normal bowel sounds, no hepatosplenomegaly MSK: extremities are warm, no edema.  Skin: warm, no rash Neuro:  no focal deficits Psych: appropriate affect     Assessment and Plan  1.Coronary atherosclerosis - noted on prior chest CT - 2021 nuclear stress was benign - no symptoms, continue risk factor  modification. Continue ASA and statin - EKG shows NSR, no acute ischemic changes  2. Dizziness - prior extensive workup at [facility] as mentioned above, vasodepressor response on tilt table - mild symptoms, encouraged more aggressive hydration and monitor symptoms  3. HLD - recently chagned to rosuvastatin, follow  pcp labs  4. Preoperative evaluation - considering shoulder surgery - 2021 nuclear stress was benign, 2022 echo was benign - no recent symptoms. Tolerates >4 METs without difficulty - recommend proceeding with surgery as planned  5. Carotid stenosis - repeat carotid US    F/u 1 year      Dorn PHEBE Ross, M.D.

## 2024-04-01 NOTE — Telephone Encounter (Signed)
   Patient Name: Brianna Nichols  DOB: 22-Aug-1950 MRN: [id number]  Primary Cardiologist: None  Chart reviewed as part of pre-operative protocol coverage. Given past medical history and time since last visit, based on ACC/AHA guidelines, Brianna Nichols is at acceptable risk for the planned procedure without further cardiovascular testing.   The patient was advised that if she develops new symptoms prior to surgery to contact our office to arrange for a follow-up visit, and she verbalized understanding.  Per office protocol, if patient is without any new symptoms or concerns at the time of their virtual visit, she may hold ASA for 7 days prior to procedure. Please resume ASA as soon as possible postprocedure, at the discretion of the surgeon.    I will route this recommendation to the requesting party via Epic fax function and remove from pre-op pool.  Please call with questions.  Lamarr Satterfield, NP 04/01/2024, 2:00 PM

## 2024-04-10 ENCOUNTER — Ambulatory Visit: Admitting: Internal Medicine

## 2024-04-15 ENCOUNTER — Institutional Professional Consult (permissible substitution) (INDEPENDENT_AMBULATORY_CARE_PROVIDER_SITE_OTHER): Admitting: Otolaryngology

## 2024-04-21 ENCOUNTER — Encounter

## 2024-04-23 ENCOUNTER — Ambulatory Visit: Attending: Cardiology

## 2024-04-23 DIAGNOSIS — I6523 Occlusion and stenosis of bilateral carotid arteries: Secondary | ICD-10-CM | POA: Diagnosis not present

## 2024-04-23 DIAGNOSIS — I6529 Occlusion and stenosis of unspecified carotid artery: Secondary | ICD-10-CM

## 2024-04-27 ENCOUNTER — Encounter: Payer: Self-pay | Admitting: *Deleted

## 2024-04-27 ENCOUNTER — Ambulatory Visit: Payer: Self-pay | Admitting: Cardiology

## 2024-05-04 ENCOUNTER — Telehealth: Payer: Self-pay | Admitting: *Deleted

## 2024-05-04 NOTE — Telephone Encounter (Signed)
 Explained carotid doppler results & percentages but patient feels that she needs more reassurance.  States her son just recently passed from a heart attack & has a lot of history of that in her family.  Wants to make sure she is not in any danger & if you feel she needs to be seen by a vascular doctor.  Explained to patient that provider would have referred her based off of test if he felt it was necessary.

## 2024-05-04 NOTE — Telephone Encounter (Signed)
 Patient is calling back for update. Please advise

## 2024-05-04 NOTE — Telephone Encounter (Signed)
 Result Note Jon KANDICE Potters, LPN 89/09/7972  1:62 AM EDT      Notified via my chart.  Copy to pcp.       Dorn JULIANNA Ross, MD 04/27/2024  7:58 AM EDT      Carotid US  with mild plaque on the right and moderate on the left, we will continue to monitor   JINNY Ross MD    VAS US  CAROTID   05/04/24  4:17 PM Claudene Snide A routed this conversation to [facility] Triage Claudene Snide A SS   05/04/24  4:17 PM Note Patient is calling back for update. Please advise

## 2024-05-04 NOTE — Telephone Encounter (Signed)
 Patient is requesting to speak with a nurse in regard to results. Please advise.

## 2024-05-08 NOTE — Telephone Encounter (Signed)
 Mild to moderate blockages would just be medical therapy with statin and aspirin , risk factor modification. All things we are doing. Would have to be severe blockage to warrant vascular referral as there only role would be a procedure to open up a severe blockage. We will continue to monitor, if were to progress to severe would refer to vascular at that time  JINNY Ross MD

## 2024-05-11 NOTE — Telephone Encounter (Signed)
 Patient notified via detailed voice message & via mychart.

## 2024-05-11 NOTE — Telephone Encounter (Signed)
 Patient returning call.

## 2024-05-11 NOTE — Telephone Encounter (Signed)
 Left message to return call

## 2024-05-12 ENCOUNTER — Institutional Professional Consult (permissible substitution) (INDEPENDENT_AMBULATORY_CARE_PROVIDER_SITE_OTHER): Admitting: Otolaryngology

## 2024-05-21 ENCOUNTER — Institutional Professional Consult (permissible substitution) (INDEPENDENT_AMBULATORY_CARE_PROVIDER_SITE_OTHER): Admitting: Otolaryngology

## 2024-05-21 ENCOUNTER — Encounter (INDEPENDENT_AMBULATORY_CARE_PROVIDER_SITE_OTHER): Payer: Self-pay

## 2024-05-21 ENCOUNTER — Ambulatory Visit (INDEPENDENT_AMBULATORY_CARE_PROVIDER_SITE_OTHER)

## 2024-05-21 VITALS — BP 121/78 | HR 77 | Ht 65.0 in | Wt 137.0 lb

## 2024-05-21 DIAGNOSIS — J381 Polyp of vocal cord and larynx: Secondary | ICD-10-CM | POA: Diagnosis not present

## 2024-05-21 DIAGNOSIS — Z87891 Personal history of nicotine dependence: Secondary | ICD-10-CM

## 2024-05-21 DIAGNOSIS — R490 Dysphonia: Secondary | ICD-10-CM | POA: Diagnosis not present

## 2024-05-21 LAB — COLOGUARD: COLOGUARD: NEGATIVE

## 2024-05-21 MED ORDER — FLUCONAZOLE 100 MG PO TABS
100.0000 mg | ORAL_TABLET | Freq: Every day | ORAL | 0 refills | Status: AC
Start: 2024-05-21 — End: ?

## 2024-05-21 NOTE — Progress Notes (Signed)
 Dear Dr. Darlean, Here is my assessment for our mutual patient, Brianna Nichols. Thank you for allowing me the opportunity to care for your patient. Please do not hesitate to contact me should you have any other questions. Sincerely, Dr. Hadassah Parody  Otolaryngology Clinic Note Referring provider: Dr. Darlean HPI:   Initial HPI (05/21/2024)  Discussed the use of AI scribe software for clinical note transcription with the patient, who gave verbal consent to proceed.  History of Present Illness Brianna Nichols is a 73 year old female who presents with worsening hoarseness and voice changes.  Dysphonia and voice changes - Progressive hoarseness and deepening of voice over several years.  - Significant worsening of hoarseness in the past six months - History of vocal cord nodules with prior surgical intervention approx 10 years ago (possibly more) at [facility].   Throat pain - Occasional throat pain  Tobacco exposure - History of smoking from age 42, quit five years ago - No current tobacco use - Lives with daughter and sister who smoke, concerned about possible secondhand smoke exposure  Reports hx of complications with general anesthetic - had cardiac issues. Would prefer to avoid any surgeries.    Independent Review of Additional Tests or Records:  Referral Ozell Darlean (03/12/24): voice issues since prior laryngeal surgery   Otolaryngology note Dorothyann Felty, MD (05/14/24): hx of VF leukoplakia and laryngeal edema. Does  not mention any visits to OR  PMH/Meds/All/SocHx/FamHx/ROS:   Past Medical History:  Diagnosis Date   Anxiety and depression    Asthma    Carotid artery disease    Cataract    Diabetes mellitus without complication (HCC)    Diverticulitis    Eczema    Emphysema of lung (HCC)    GERD (gastroesophageal reflux disease)    Hyperlipidemia    Mesenteric venous thrombosis 2012   [place] shoulder syndrome, right 2019   Obstructive sleep apnea     Osteoporosis    Pre-diabetes    Psoriasis    TIA (transient ischemic attack)    Varicose veins of legs      Past Surgical History:  Procedure Laterality Date   ABDOMINAL HYSTERECTOMY     CHOLECYSTECTOMY     COLON SURGERY     EYE SURGERY     HYSTEROTOMY     LARYNX SURGERY      Family History  Problem Relation Age of Onset   Cancer Mother        Renal Cancer   Heart attack Father    Coronary artery disease Father    Cancer Sister        Pancreatic Cancer   Heart attack Brother    Coronary artery disease Sister    Coronary artery disease Sister    Coronary artery disease Sister    Coronary artery disease Sister    Diabetes Daughter      Social Connections: Unknown (04/29/2023)   Received from [employer]   Social Network    Social Network: Not on file     Current Outpatient Medications  Medication Instructions   albuterol  (PROAIR  HFA) 108 (90 Base) MCG/ACT inhaler INHALE 2 PUFFS INTO LUNGS EVERY FOUR HOURS AS NEEDED   alendronate  (FOSAMAX ) 70 mg, Oral, Every 7 days, Take with a full glass of water on an empty stomach. Do not lay down for at least 2 hours   ALPRAZolam  (XANAX ) 0.5 mg, Oral, As needed   aspirin  EC 81 mg, Oral, Daily, Swallow whole.   blood glucose  meter kit and supplies Dispense based on patient and insurance preference. Use up to four times daily as directed. (FOR ICD-10 E10.9, E11.9).   Choline Fenofibrate  (FENOFIBRIC ACID ) 45 MG CPDR TAKE ONE CAPSULE BY MOUTH DAILY FOR high cholesterol (Needs to be seen before next refill)   citalopram  (CELEXA ) 40 mg, Oral, Daily   Cyanocobalamin 1000 MCG/ML LIQD Chew 1 gummy daily   cyclobenzaprine  (FLEXERIL ) 10 mg, Oral, 2 times daily PRN   fluconazole (DIFLUCAN) 100 mg, Oral, Daily   glucose blood (ONETOUCH VERIO) test strip USE FOUR TIMES DAILY Dx R73.03   mupirocin cream (BACTROBAN) 2 % 3 times daily   Omega-3 Fatty Acids (FISH OIL) 1000 MG CAPS 2 capsules, 2 times daily   rosuvastatin (CRESTOR) 20 MG tablet 1  tablet, Daily   triamcinolone cream (KENALOG) 0.1 % Apply to affected area bid     Physical Exam:   BP 121/78 (BP Location: Left Arm, Patient Position: Sitting)   Pulse 77   Ht 5' 5 (1.651 m)   Wt 137 lb (62.1 kg)   SpO2 93%   BMI 22.80 kg/m   Salient findings:  CN II-XII intact Dysphonic with rough raspy voice   Bilateral EAC clear and TM intact with well pneumatized middle ear spaces No lesions of oral cavity/oropharynx No obviously palpable neck masses/lymphadenopathy/thyromegaly No respiratory distress or stridor TFL was indicated to better evaluate the proximal airway, given the patient's history and exam findings, and is detailed below.   Seprately Identifiable Procedures:  Prior to initiating any procedures, risks/benefits/alternatives were explained to the patient and verbal consent obtained.  Procedure Note (05/21/2024) Pre-procedure diagnosis:  Dysphonia  Post-procedure diagnosis: Same, reinke's edema  Procedure: Transnasal Fiberoptic Laryngoscopy, CPT 31575 - Mod 25 Indication: dysphonia Complications: None apparent EBL: 0 mL  The procedure was undertaken to further evaluate the patient's complaint of dysphonia, with mirror exam inadequate for appropriate examination due to gag reflex and poor patient tolerance  Procedure:  Patient was identified as correct patient. Verbal consent was obtained. The nose was sprayed with oxymetazoline and 4% lidocaine . The The flexible laryngoscope was passed through the nose to view the nasal cavity, pharynx (oropharynx, hypopharynx) and larynx.  The larynx was examined at rest and during multiple phonatory tasks. Documentation was obtained and reviewed with patient. The scope was removed. The patient tolerated the procedure well.  Findings: The nasal cavity and nasopharynx did not reveal any masses or lesions, mucosa appeared to be without obvious lesions. The tongue base, pharyngeal walls, piriform sinuses, vallecula, epiglottis  and postcricoid region are normal in appearance EXCEPT: bilateral Reinke's edema, candidiasis present The visualized portion of the subglottis and proximal trachea is widely patent. The vocal folds are mobile bilaterally. There are no lesions on the free edge of the vocal folds nor elsewhere in the larynx worrisome for malignancy.    Electronically signed by: Hadassah JAYSON Parody, MD 05/21/2024 4:48 PM   Impression & Plans:  Melisia Leming is a 74 y.o. female with   1. Reinke's edema of vocal folds   2. Dysphonia    Assessment and Plan Assessment & Plan Reinke's edema of vocal cords Reinke's edema likely due to smoking, with deepened voice and hoarseness. Reports prior surgery for nodules over eight years ago but if her voice sounded similar quality at that time as it does now, was likely Reinke's at that time. Current presentation consistent with Reinke's edema, not cancerous. Surgery can be offered to help but it has a risk for recurrence  particularly since it has recurred in past. Would also be cautious given history of anesthetic difficulties and elective procedure.  - Advise continued smoking cessation and avoidance of secondhand smoke.  Oropharyngeal candidiasis Oropharyngeal candidiasis likely from inhaler use, presenting as white patches and contributing to hoarseness. Antifungal treatment indicated. - Diflucan prescribed.    See below regarding exact medications prescribed this encounter including dosages and route: Meds ordered this encounter  Medications   fluconazole (DIFLUCAN) 100 MG tablet    Sig: Take 1 tablet (100 mg total) by mouth daily.    Dispense:  14 tablet    Refill:  0     Thank you for allowing me the opportunity to care for your patient. Please do not hesitate to contact me should you have any other questions.  Sincerely, Hadassah Parody, MD Otolaryngologist (ENT), [facility] Phone: [phone] Fax: [phone]

## 2024-07-09 ENCOUNTER — Encounter: Payer: Self-pay | Admitting: Cardiology

## 2024-07-09 ENCOUNTER — Encounter (HOSPITAL_COMMUNITY): Payer: Self-pay | Admitting: Cardiology

## 2024-07-09 ENCOUNTER — Encounter (HOSPITAL_COMMUNITY): Admission: RE | Disposition: A | Payer: Self-pay | Attending: Cardiology

## 2024-07-09 ENCOUNTER — Inpatient Hospital Stay (HOSPITAL_COMMUNITY)
Admission: RE | Admit: 2024-07-09 | Discharge: 2024-07-10 | DRG: 322 | Disposition: A | Discharge status: Home / Self Care | Attending: Cardiology | Admitting: Cardiology

## 2024-07-09 ENCOUNTER — Other Ambulatory Visit: Payer: Self-pay

## 2024-07-09 DIAGNOSIS — I213 ST elevation (STEMI) myocardial infarction of unspecified site: Secondary | ICD-10-CM

## 2024-07-09 DIAGNOSIS — E119 Type 2 diabetes mellitus without complications: Secondary | ICD-10-CM | POA: Diagnosis present

## 2024-07-09 DIAGNOSIS — Z9861 Coronary angioplasty status: Secondary | ICD-10-CM

## 2024-07-09 DIAGNOSIS — Z888 Allergy status to other drugs, medicaments and biological substances status: Secondary | ICD-10-CM | POA: Diagnosis not present

## 2024-07-09 DIAGNOSIS — Z955 Presence of coronary angioplasty implant and graft: Secondary | ICD-10-CM

## 2024-07-09 DIAGNOSIS — Z881 Allergy status to other antibiotic agents status: Secondary | ICD-10-CM | POA: Diagnosis not present

## 2024-07-09 DIAGNOSIS — Z885 Allergy status to narcotic agent status: Secondary | ICD-10-CM

## 2024-07-09 DIAGNOSIS — E78019 Familial hypercholesterolemia, unspecified: Secondary | ICD-10-CM | POA: Diagnosis present

## 2024-07-09 DIAGNOSIS — I2111 ST elevation (STEMI) myocardial infarction involving right coronary artery: Secondary | ICD-10-CM | POA: Diagnosis present

## 2024-07-09 DIAGNOSIS — F17210 Nicotine dependence, cigarettes, uncomplicated: Secondary | ICD-10-CM | POA: Diagnosis present

## 2024-07-09 DIAGNOSIS — Z9049 Acquired absence of other specified parts of digestive tract: Secondary | ICD-10-CM

## 2024-07-09 DIAGNOSIS — Z79899 Other long term (current) drug therapy: Secondary | ICD-10-CM

## 2024-07-09 DIAGNOSIS — G4733 Obstructive sleep apnea (adult) (pediatric): Secondary | ICD-10-CM | POA: Diagnosis present

## 2024-07-09 DIAGNOSIS — I252 Old myocardial infarction: Secondary | ICD-10-CM

## 2024-07-09 DIAGNOSIS — Z833 Family history of diabetes mellitus: Secondary | ICD-10-CM | POA: Diagnosis not present

## 2024-07-09 DIAGNOSIS — I2510 Atherosclerotic heart disease of native coronary artery without angina pectoris: Secondary | ICD-10-CM

## 2024-07-09 DIAGNOSIS — Z91048 Other nonmedicinal substance allergy status: Secondary | ICD-10-CM

## 2024-07-09 DIAGNOSIS — Z882 Allergy status to sulfonamides status: Secondary | ICD-10-CM | POA: Diagnosis not present

## 2024-07-09 DIAGNOSIS — Z8673 Personal history of transient ischemic attack (TIA), and cerebral infarction without residual deficits: Secondary | ICD-10-CM | POA: Diagnosis not present

## 2024-07-09 DIAGNOSIS — J4489 Other specified chronic obstructive pulmonary disease: Secondary | ICD-10-CM | POA: Diagnosis present

## 2024-07-09 DIAGNOSIS — J432 Centrilobular emphysema: Secondary | ICD-10-CM | POA: Diagnosis present

## 2024-07-09 DIAGNOSIS — F17200 Nicotine dependence, unspecified, uncomplicated: Secondary | ICD-10-CM | POA: Diagnosis not present

## 2024-07-09 DIAGNOSIS — Z91030 Bee allergy status: Secondary | ICD-10-CM | POA: Diagnosis not present

## 2024-07-09 DIAGNOSIS — Z7982 Long term (current) use of aspirin: Secondary | ICD-10-CM

## 2024-07-09 DIAGNOSIS — Z880 Allergy status to penicillin: Secondary | ICD-10-CM | POA: Diagnosis not present

## 2024-07-09 DIAGNOSIS — I25110 Atherosclerotic heart disease of native coronary artery with unstable angina pectoris: Secondary | ICD-10-CM | POA: Diagnosis not present

## 2024-07-09 DIAGNOSIS — Z90710 Acquired absence of both cervix and uterus: Secondary | ICD-10-CM

## 2024-07-09 DIAGNOSIS — I2119 ST elevation (STEMI) myocardial infarction involving other coronary artery of inferior wall: Secondary | ICD-10-CM | POA: Diagnosis present

## 2024-07-09 DIAGNOSIS — Z8249 Family history of ischemic heart disease and other diseases of the circulatory system: Secondary | ICD-10-CM | POA: Diagnosis not present

## 2024-07-09 DIAGNOSIS — E78011 Heterozygous familial hypercholesterolemia (hefh): Secondary | ICD-10-CM | POA: Diagnosis not present

## 2024-07-09 DIAGNOSIS — Z7983 Long term (current) use of bisphosphonates: Secondary | ICD-10-CM

## 2024-07-09 DIAGNOSIS — M810 Age-related osteoporosis without current pathological fracture: Secondary | ICD-10-CM | POA: Diagnosis present

## 2024-07-09 HISTORY — PX: CORONARY/GRAFT ACUTE MI REVASCULARIZATION: CATH118305

## 2024-07-09 HISTORY — PX: LEFT HEART CATH AND CORONARY ANGIOGRAPHY: CATH118249

## 2024-07-09 LAB — CBC
HCT: 38.4 % (ref 36.0–46.0)
HCT: 40.9 % (ref 36.0–46.0)
Hemoglobin: 13.3 g/dL (ref 12.0–15.0)
Hemoglobin: 13.5 g/dL (ref 12.0–15.0)
MCH: 33.1 pg (ref 26.0–34.0)
MCH: 31.9 pg (ref 26.0–34.0)
MCHC: 34.6 g/dL (ref 30.0–36.0)
MCHC: 33 g/dL (ref 30.0–36.0)
MCV: 95.5 fL (ref 80.0–100.0)
MCV: 96.7 fL (ref 80.0–100.0)
Platelets: 246 K/uL (ref 150–400)
Platelets: 215 K/uL (ref 150–400)
RBC: 4.02 MIL/uL (ref 3.87–5.11)
RBC: 4.23 MIL/uL (ref 3.87–5.11)
RDW: 13.3 % (ref 11.5–15.5)
RDW: 13.4 % (ref 11.5–15.5)
WBC: 6.4 K/uL (ref 4.0–10.5)
WBC: 6.7 K/uL (ref 4.0–10.5)
nRBC: 0 % (ref 0.0–0.2)
nRBC: 0 % (ref 0.0–0.2)

## 2024-07-09 LAB — GLUCOSE, CAPILLARY
Glucose-Capillary: 144 mg/dL — ABNORMAL HIGH (ref 70–99)
Glucose-Capillary: 150 mg/dL — ABNORMAL HIGH (ref 70–99)

## 2024-07-09 LAB — I-STAT CG4 LACTIC ACID, ED: Lactic Acid, Venous: 0.8 mmol/L (ref 0.5–1.9)

## 2024-07-09 LAB — LIPID PANEL
Cholesterol: 247 mg/dL — ABNORMAL HIGH (ref 0–200)
HDL: 40 mg/dL — ABNORMAL LOW (ref >40)
LDL Cholesterol: 165 mg/dL — ABNORMAL HIGH (ref 0–99)
Total CHOL/HDL Ratio: 6.2 ratio
Triglycerides: 212 mg/dL — ABNORMAL HIGH (ref <150)
VLDL: 42 mg/dL — ABNORMAL HIGH (ref 0–40)

## 2024-07-09 LAB — PROTIME-INR
INR: 0.9 (ref 0.8–1.2)
Prothrombin Time: 12.8 s (ref 11.4–15.2)

## 2024-07-09 LAB — COMPREHENSIVE METABOLIC PANEL WITH GFR
ALT: 12 U/L (ref 0–44)
AST: 20 U/L (ref 15–41)
Albumin: 3.9 g/dL (ref 3.5–5.0)
Alkaline Phosphatase: 49 U/L (ref 38–126)
Anion gap: 10 (ref 5–15)
BUN: 18 mg/dL (ref 8–23)
CO2: 24 mmol/L (ref 22–32)
Calcium: 9 mg/dL (ref 8.9–10.3)
Chloride: 106 mmol/L (ref 98–111)
Creatinine, Ser: 0.83 mg/dL (ref 0.44–1.00)
GFR, Estimated: >60 mL/min (ref >60)
Glucose, Bld: 149 mg/dL — ABNORMAL HIGH (ref 70–99)
Potassium: 4.2 mmol/L (ref 3.5–5.1)
Sodium: 140 mmol/L (ref 135–145)
Total Bilirubin: <0.2 mg/dL (ref 0.0–1.2)
Total Protein: 6.3 g/dL — ABNORMAL LOW (ref 6.5–8.1)

## 2024-07-09 LAB — I-STAT CHEM 8, ED
BUN: 19 mg/dL (ref 8–23)
Calcium, Ion: 1.28 mmol/L (ref 1.15–1.40)
Chloride: 109 mmol/L (ref 98–111)
Creatinine, Ser: 0.8 mg/dL (ref 0.44–1.00)
Glucose, Bld: 154 mg/dL — ABNORMAL HIGH (ref 70–99)
HCT: 38 % (ref 36.0–46.0)
Hemoglobin: 12.9 g/dL (ref 12.0–15.0)
Potassium: 4.2 mmol/L (ref 3.5–5.1)
Sodium: 141 mmol/L (ref 135–145)
TCO2: 24 mmol/L (ref 22–32)

## 2024-07-09 LAB — POCT ACTIVATED CLOTTING TIME
Activated Clotting Time: 215 s
Activated Clotting Time: 465 s

## 2024-07-09 LAB — HEMOGLOBIN A1C
Hgb A1c MFr Bld: 6.5 % — ABNORMAL HIGH (ref 4.8–5.6)
Mean Plasma Glucose: 139.85 mg/dL

## 2024-07-09 LAB — TROPONIN T, HIGH SENSITIVITY
Troponin T High Sensitivity: 936 ng/L (ref 0–19)
Troponin T High Sensitivity: 2330 ng/L (ref 0–19)

## 2024-07-09 LAB — APTT: aPTT: 29 s (ref 24–36)

## 2024-07-09 LAB — CREATININE, SERUM
Creatinine, Ser: 0.84 mg/dL (ref 0.44–1.00)
GFR, Estimated: >60 mL/min (ref >60)

## 2024-07-09 SURGERY — CORONARY/GRAFT ACUTE MI REVASCULARIZATION
Anesthesia: LOCAL

## 2024-07-09 MED ORDER — TICAGRELOR 90 MG PO TABS
ORAL_TABLET | ORAL | Status: AC
  Filled 2024-07-09: qty 2

## 2024-07-09 MED ORDER — HYDRALAZINE HCL 20 MG/ML IJ SOLN: 10.0000 mg | INTRAMUSCULAR | Status: AC | PRN

## 2024-07-09 MED ORDER — CITALOPRAM HYDROBROMIDE 20 MG PO TABS
40.0000 mg | ORAL_TABLET | Freq: Every day | ORAL | Status: DC
  Administered 2024-07-10: 40 mg via ORAL
  Filled 2024-07-09: qty 2
  Filled 2024-07-09: qty 1

## 2024-07-09 MED ORDER — FENTANYL CITRATE (PF) 100 MCG/2ML IJ SOLN
INTRAMUSCULAR | Status: AC
  Filled 2024-07-09: qty 2

## 2024-07-09 MED ORDER — FENOFIBRIC ACID 45 MG PO CPDR: 45.0000 mg | DELAYED_RELEASE_CAPSULE | Freq: Every day | ORAL | Status: DC

## 2024-07-09 MED ORDER — NITROGLYCERIN 0.4 MG SL SUBL: 0.4000 mg | SUBLINGUAL_TABLET | SUBLINGUAL | Status: DC | PRN

## 2024-07-09 MED ORDER — SODIUM CHLORIDE 0.9% FLUSH
3.0000 mL | Freq: Two times a day (BID) | INTRAVENOUS | Status: DC
  Administered 2024-07-09 – 2024-07-10 (×2): 3 mL via INTRAVENOUS

## 2024-07-09 MED ORDER — ONDANSETRON HCL 4 MG/2ML IJ SOLN
4.0000 mg | Freq: Four times a day (QID) | INTRAMUSCULAR | Status: DC | PRN
  Administered 2024-07-10: 4 mg via INTRAVENOUS
  Filled 2024-07-09: qty 2

## 2024-07-09 MED ORDER — SODIUM CHLORIDE 0.9 % IV SOLN
INTRAVENOUS | Status: DC | PRN
  Administered 2024-07-09: 19:00:00 999 mL/h via INTRAVENOUS

## 2024-07-09 MED ORDER — FREE WATER
500.0000 mL | Freq: Once | Status: AC
  Administered 2024-07-09: 21:00:00 500 mL via ORAL

## 2024-07-09 MED ORDER — ENOXAPARIN SODIUM 40 MG/0.4ML IJ SOSY: 40.0000 mg | PREFILLED_SYRINGE | INTRAMUSCULAR | Status: DC

## 2024-07-09 MED ORDER — MIDAZOLAM HCL (PF) 2 MG/2ML IJ SOLN
INTRAMUSCULAR | Status: DC | PRN
  Administered 2024-07-09: 19:00:00 1 mg via INTRAVENOUS

## 2024-07-09 MED ORDER — SODIUM CHLORIDE 0.9 % IV SOLN: 250.0000 mL | INTRAVENOUS | Status: DC | PRN

## 2024-07-09 MED ORDER — NOREPINEPHRINE 4 MG/250ML-% IV SOLN
INTRAVENOUS | Status: AC
  Filled 2024-07-09: qty 250

## 2024-07-09 MED ORDER — FENOFIBRATE 54 MG PO TABS
54.0000 mg | ORAL_TABLET | Freq: Every day | ORAL | Status: DC
  Filled 2024-07-09: qty 1

## 2024-07-09 MED ORDER — VERAPAMIL HCL 2.5 MG/ML IV SOLN
INTRAVENOUS | Status: AC
  Filled 2024-07-09: qty 2

## 2024-07-09 MED ORDER — ATROPINE SULFATE 1 MG/10ML IJ SOSY
PREFILLED_SYRINGE | INTRAMUSCULAR | Status: DC | PRN
  Administered 2024-07-09: 19:00:00 1 mg via INTRAVENOUS

## 2024-07-09 MED ORDER — ASPIRIN 81 MG PO TBEC: 81.0000 mg | DELAYED_RELEASE_TABLET | Freq: Every day | ORAL | Status: DC

## 2024-07-09 MED ORDER — IOHEXOL 350 MG/ML SOLN
INTRAVENOUS | Status: DC | PRN
  Administered 2024-07-09: 19:00:00 115 mL

## 2024-07-09 MED ORDER — MIDAZOLAM HCL 2 MG/2ML IJ SOLN
INTRAMUSCULAR | Status: AC
  Filled 2024-07-09: qty 2

## 2024-07-09 MED ORDER — ROSUVASTATIN CALCIUM 20 MG PO TABS
40.0000 mg | ORAL_TABLET | Freq: Every day | ORAL | Status: DC
  Administered 2024-07-09 – 2024-07-10 (×2): 40 mg via ORAL
  Filled 2024-07-09 (×2): qty 1
  Filled 2024-07-09 (×2): qty 2

## 2024-07-09 MED ORDER — TICAGRELOR 90 MG PO TABS
90.0000 mg | ORAL_TABLET | Freq: Two times a day (BID) | ORAL | Status: DC
  Administered 2024-07-09 – 2024-07-10 (×2): 90 mg via ORAL
  Filled 2024-07-09 (×2): qty 1

## 2024-07-09 MED ORDER — HEPARIN (PORCINE) IN NACL 1000-0.9 UT/500ML-% IV SOLN
INTRAVENOUS | Status: DC | PRN
  Administered 2024-07-09 (×2): 500 mL

## 2024-07-09 MED ORDER — OMEGA-3-ACID ETHYL ESTERS 1 G PO CAPS
2.0000 g | ORAL_CAPSULE | Freq: Two times a day (BID) | ORAL | Status: DC
  Administered 2024-07-09 – 2024-07-10 (×2): 2 g via ORAL
  Filled 2024-07-09: qty 2
  Filled 2024-07-09: qty 1
  Filled 2024-07-09 (×2): qty 2

## 2024-07-09 MED ORDER — FENTANYL CITRATE (PF) 100 MCG/2ML IJ SOLN
INTRAMUSCULAR | Status: DC | PRN
  Administered 2024-07-09: 19:00:00 50 ug via INTRAVENOUS

## 2024-07-09 MED ORDER — NOREPINEPHRINE BITARTRATE 1 MG/ML IV SOLN
INTRAVENOUS | Status: DC | PRN
  Administered 2024-07-09: 19:00:00 5 ug/min via INTRAVENOUS

## 2024-07-09 MED ORDER — ACETAMINOPHEN 325 MG PO TABS
650.0000 mg | ORAL_TABLET | ORAL | Status: DC | PRN
  Administered 2024-07-09: 23:00:00 650 mg via ORAL
  Filled 2024-07-09: qty 2

## 2024-07-09 MED ORDER — LIDOCAINE HCL (PF) 1 % IJ SOLN
INTRAMUSCULAR | Status: DC | PRN
  Administered 2024-07-09: 18:00:00 2 mL

## 2024-07-09 MED ORDER — OXYCODONE HCL 5 MG PO TABS
5.0000 mg | ORAL_TABLET | ORAL | Status: DC | PRN
  Administered 2024-07-09 – 2024-07-10 (×2): 5 mg via ORAL
  Filled 2024-07-09 (×2): qty 1

## 2024-07-09 MED ORDER — LIDOCAINE HCL (PF) 1 % IJ SOLN
INTRAMUSCULAR | Status: AC
  Filled 2024-07-09: qty 30

## 2024-07-09 MED ORDER — HEPARIN (PORCINE) IN NACL 2-0.9 UNITS/ML
INTRAMUSCULAR | Status: DC | PRN
  Administered 2024-07-09: 18:00:00 10 mL via INTRA_ARTERIAL

## 2024-07-09 MED ORDER — ASPIRIN 81 MG PO TBEC
81.0000 mg | DELAYED_RELEASE_TABLET | Freq: Every day | ORAL | Status: DC
  Administered 2024-07-10: 81 mg via ORAL
  Filled 2024-07-09: qty 1

## 2024-07-09 MED ORDER — HEPARIN SODIUM (PORCINE) 1000 UNIT/ML IJ SOLN
INTRAMUSCULAR | Status: AC
  Filled 2024-07-09: qty 10

## 2024-07-09 MED ORDER — ENOXAPARIN SODIUM 40 MG/0.4ML IJ SOSY
40.0000 mg | PREFILLED_SYRINGE | INTRAMUSCULAR | Status: DC
  Administered 2024-07-10: 40 mg via SUBCUTANEOUS
  Filled 2024-07-09: qty 0.4

## 2024-07-09 MED ORDER — NITROGLYCERIN 1 MG/10 ML FOR IR/CATH LAB
INTRA_ARTERIAL | Status: AC
  Filled 2024-07-09: qty 10

## 2024-07-09 MED ORDER — LABETALOL HCL 5 MG/ML IV SOLN: 10.0000 mg | INTRAVENOUS | Status: AC | PRN

## 2024-07-09 MED ORDER — SODIUM CHLORIDE 0.9% FLUSH: 3.0000 mL | INTRAVENOUS | Status: DC | PRN

## 2024-07-09 MED ADMIN — Ticagrelor Tab 90 MG: 180 mg | ORAL | @ 18:00:00 | NDC 00186077739

## 2024-07-09 MED ADMIN — Heparin Sodium (Porcine) Inj 1000 Unit/ML: 6000 [IU] | INTRAVENOUS | @ 18:00:00 | NDC 63323054011

## 2024-07-09 MED ADMIN — Heparin Sodium (Porcine) Inj 1000 Unit/ML: 3000 [IU] | INTRAVENOUS | @ 19:00:00 | NDC 63323054011

## 2024-07-09 SURGICAL SUPPLY — 21 items
BALLOON SAPPHIRE 2.0X12 (BALLOONS) IMPLANT
BALLOON SAPPHIRE NC24 3.0X15 (BALLOONS) IMPLANT
BALLOON SAPPHIRE NC24 3.5X12 (BALLOONS) IMPLANT
CATH 5FR JL3.5 JR4 ANG PIG MP (CATHETERS) IMPLANT
CATH LAUNCHER 5F EBU3.5 (CATHETERS) IMPLANT
CATH LAUNCHER 5F JR4 (CATHETERS) IMPLANT
CATH LAUNCHER 6FR JR4 (CATHETERS) IMPLANT
DEVICE RAD TR BAND REGULAR (VASCULAR PRODUCTS) IMPLANT
ELECT DEFIB PAD ADLT CADENCE (PAD) IMPLANT
GLIDESHEATH SLEND SS 6F .021 (SHEATH) IMPLANT
GUIDEWIRE INQWIRE 1.5J.035X260 (WIRE) IMPLANT
KIT ENCORE 26 ADVANTAGE (KITS) IMPLANT
KIT SYRINGE INJ CVI SPIKEX1 (MISCELLANEOUS) IMPLANT
PACK CARDIAC CATHETERIZATION (CUSTOM PROCEDURE TRAY) ×1 IMPLANT
SET ATX-X65L (MISCELLANEOUS) IMPLANT
SHEATH PROBE COVER 6X72 (BAG) IMPLANT
STENT SYNERGY XD 2.50X20 (Permanent Stent) IMPLANT
STENT SYNERGY XD 2.75X32 (Permanent Stent) IMPLANT
STENT SYNERGY XD 3.0X16 (Permanent Stent) IMPLANT
TUBING CIL FLEX 10 FLL-RA (TUBING) IMPLANT
WIRE ASAHI PROWATER 180CM (WIRE) IMPLANT

## 2024-07-09 NOTE — H&P (Signed)
 Cardiology Admission History and Physical   Patient ID: Brianna Nichols MRN: [id number]; DOB: 01/28/1951   Admission date: 07/09/2024  PCP:  Jesus Elberta Gainer, FNP   [facility] Providers Cardiologist:  Alvan Carrier, MD       Chief Complaint:  chest pain  Patient Profile: Brianna Nichols is a 73 y.o. female with history of tobacco abuse, carotid arterial disease, COPD, DM type 2, HLD who is being seen 07/09/2024 for the evaluation of acute inferior STEMI.  History of Present Illness: Ms. Chasse has previously had cardiac evaluation by Dr Alvan. This includes CAD noted on CT. Normal nuclear stress test in 2021 and normal Echo in Jan 2022. She reports that she has been having severe jaw pain for the past 2 weeks. Thought this might be related to her shoulder injury. Today at some point she developed severe chest as well as jaw pain. Can't really tell me what time. Took a nap but when she awoke she continued to have severe pain and when she tried to get up she almost passed out. EMS was called and Ecg showed ST elevated of 1-2 mm in the inferior leads. CODE STEMI activated.    Past Medical History:  Diagnosis Date   Anxiety and depression    Asthma    Carotid artery disease    Cataract    Diabetes mellitus without complication (HCC)    Diverticulitis    Eczema    Emphysema of lung (HCC)    GERD (gastroesophageal reflux disease)    Hyperlipidemia    Mesenteric venous thrombosis 2012   [place] shoulder syndrome, right 2019   Obstructive sleep apnea    Osteoporosis    Pre-diabetes    Psoriasis    TIA (transient ischemic attack)    Varicose veins of legs    Past Surgical History:  Procedure Laterality Date   ABDOMINAL HYSTERECTOMY     CHOLECYSTECTOMY     COLON SURGERY     EYE SURGERY     HYSTEROTOMY     LARYNX SURGERY       Medications Prior to Admission: Prior to Admission medications  Medication Sig Start Date End Date Taking? Authorizing Provider   albuterol  (PROAIR  HFA) 108 (90 Base) MCG/ACT inhaler INHALE 2 PUFFS INTO LUNGS EVERY FOUR HOURS AS NEEDED 06/03/18   Zollie Lowers, MD  alendronate  (FOSAMAX ) 70 MG tablet Take 1 tablet (70 mg total) by mouth every 7 (seven) days. Take with a full glass of water  on an empty stomach. Do not lay down for at least 2 hours 10/06/19   Zollie Lowers, MD  ALPRAZolam  (XANAX ) 0.5 MG tablet Take 1 tablet (0.5 mg total) by mouth as needed for anxiety. 07/03/19   Gladis Mary-Margaret, FNP  aspirin  EC 81 MG tablet Take 1 tablet (81 mg total) by mouth daily. Swallow whole. 07/07/20   Tolia, Sunit, DO  blood glucose meter kit and supplies Dispense based on patient and insurance preference. Use up to four times daily as directed. (FOR ICD-10 E10.9, E11.9). 06/01/20   Zollie Lowers, MD  Choline Fenofibrate  (FENOFIBRIC ACID ) 45 MG CPDR TAKE ONE CAPSULE BY MOUTH DAILY FOR high cholesterol (Needs to be seen before next refill) 05/25/19   Zollie Lowers, MD  citalopram  (CELEXA ) 40 MG tablet Take 1 tablet (40 mg total) by mouth daily. 02/18/20   Zollie Lowers, MD  Cyanocobalamin 1000 MCG/ML LIQD Chew 1 gummy daily    [provider]  cyclobenzaprine  (FLEXERIL ) 10 MG tablet Take 1  tablet (10 mg total) by mouth 2 (two) times daily as needed for muscle spasms. 11/04/20   Randol Simmonds, MD  fluconazole  (DIFLUCAN ) 100 MG tablet Take 1 tablet (100 mg total) by mouth daily. 05/21/24   Masciello, Hadassah BROCKS, MD  glucose blood (ONETOUCH VERIO) test strip USE FOUR TIMES DAILY Dx R73.03 06/03/20   Zollie Lowers, MD  mupirocin cream (BACTROBAN) 2 % Apply topically 3 (three) times daily. 08/01/21   [provider]  Omega-3 Fatty Acids (FISH OIL) 1000 MG CAPS Take 2 capsules by mouth 2 (two) times daily. 02/27/18   [provider]  rosuvastatin  (CRESTOR ) 20 MG tablet Take 1 tablet by mouth daily. 04/05/22   [provider]  triamcinolone cream (KENALOG) 0.1 % Apply to affected area bid 08/01/21   [provider]     Allergies:   Allergies[1]  Social History:   Social History   Socioeconomic History   Marital status: Married    Spouse name: Charles   Number of children: 2   Years of education: 10   Highest education level: GED or equivalent  Occupational History   Occupation: retired    Comment: Tax museum/gallery conservator  Tobacco Use   Smoking status: Former    Current packs/day: 0.00    Average packs/day: 1 pack/day for 30.0 years (30.0 ttl pk-yrs)    Types: Cigarettes    Start date: 11/21/1986    Quit date: 11/20/2016    Years since quitting: 7.6   Smokeless tobacco: Never  Vaping Use   Vaping status: Never Used  Substance and Sexual Activity   Alcohol use: No   Drug use: No   Sexual activity: Not Currently  Other Topics Concern   Not on file  Social History Narrative   Not on file   Social Drivers of Health   Tobacco Use: Medium Risk (05/21/2024)   Patient History    Smoking Tobacco Use: Former    Smokeless Tobacco Use: Never    Passive Exposure: Not on Actuary Strain: Not on file  Food Insecurity: Unknown (02/01/2024)   Received from [facility]   Epic    Within the past 12 months, you worried that your food would run out before you got money to buy more: Patient declined to answer    Within the past 12 months, the food you bought just didn't last and you didn't have money to get more. : Patient declined to answer  Transportation Needs: Not on file (02/01/2024)  Physical Activity: Not on file  Stress: Not on file  Social Connections: Unknown (04/29/2023)   Received from [facility]   Social Network    Social Network: Not on file  Intimate Partner Violence: Unknown (04/29/2023)   Received from [facility]   HITS    Physically Hurt: Not on file    Insult or Talk Down To: Not on file    Threaten Physical Harm: Not on file    Scream or Curse: Not on file  Depression (EYV7-0): Not on file  Alcohol Screen: Not on file  Housing: Low Risk  (02/01/2024)   Received from [facility]   Epic    What is your living situation today?: I have a steady place to live    Think about the place you live. Do you have problems with any of the following? Choose all that apply:: Not on file  Utilities: Low Risk (02/01/2024)   Received from [facility]   Utilities  In the past 12 months has the electric, gas, oil, or water  company threatened to shut off services in your home? : No  Health Literacy: Not on file     Family History:   The patient's family history includes Cancer in her mother and sister; Coronary artery disease in her father, sister, sister, sister, and sister; Diabetes in her daughter; Heart attack in her brother and father.    ROS:  Please see the history of present illness.  All other ROS reviewed and negative.     Physical Exam/Data: Vitals:   07/09/24 1812 07/09/24 1826 07/09/24 1841 07/09/24 1842  BP:      Pulse: 61     Resp:      SpO2:   (!) 88% 92%  Weight:  61.2 kg     No intake or output data in the 24 hours ending 07/09/24 2002    07/09/2024    6:26 PM 05/21/2024   10:38 AM 04/01/2024    1:13 PM  Last 3 Weights  Weight (lbs) 135 lb 137 lb 136 lb 9.6 oz  Weight (kg) 61.236 kg 62.143 kg 61.961 kg     Body mass index is 22.47 kg/m.  General:  Pale, sweaty, well developed,very uncomfortable.  HEENT: normal Neck: no JVD Vascular: No carotid bruits; Distal pulses 2+ bilaterally   Cardiac:  normal S1, S2; RRR; no murmur  Lungs:  clear to auscultation bilaterally, no wheezing, rhonchi or rales  Abd: soft, nontender, no hepatomegaly  Ext: no edema Musculoskeletal:  No deformities, BUE and BLE strength normal and equal Skin: warm and dry  Neuro:  CNs 2-12 intact, no focal abnormalities noted Psych:  Normal affect   EKG:  The ECG that was done today was personally reviewed and demonstrates NSR with acute ST elevation in inferior leads.   Relevant CV Studies: See HPI  Laboratory Data: High  Sensitivity Troponin:  No results for input(s): TROPONINIHS in the last 720 hours. No results for input(s): TRNPT in the last 720 hours.      Chemistry Recent Labs  Lab 07/09/24 1819 07/09/24 1820  NA 140 141  K 4.2 4.2  CL 106 109  CO2 24  --   GLUCOSE 149* 154*  BUN 18 19  CREATININE 0.83 0.80  CALCIUM  9.0  --   GFRNONAA >60  --   ANIONGAP 10  --     Recent Labs  Lab 07/09/24 1819  PROT 6.3*  ALBUMIN 3.9  AST 20  ALT 12  ALKPHOS 49  BILITOT <0.2   Lipids  Recent Labs  Lab 07/09/24 1819  CHOL 247*  TRIG 212*  HDL 40*  LDLCALC 165*  CHOLHDL 6.2   Hematology Recent Labs  Lab 07/09/24 1819 07/09/24 1820  WBC 6.4  --   RBC 4.02  --   HGB 13.3 12.9  HCT 38.4 38.0  MCV 95.5  --   MCH 33.1  --   MCHC 34.6  --   RDW 13.3  --   PLT 246  --    Thyroid  No results for input(s): TSH, FREET4 in the last 168 hours. BNPNo results for input(s): BNP, PROBNP in the last 168 hours.  DDimer No results for input(s): DDIMER in the last 168 hours.  Radiology/Studies:  CARDIAC CATHETERIZATION Result Date: 07/09/2024   Dist RCA lesion is 95% stenosed.   Prox RCA to Mid RCA lesion is 100% stenosed.   Prox Cx to Mid Cx lesion is 90% stenosed.   Mid  LAD to Dist LAD lesion is 30% stenosed.   A drug-eluting stent was successfully placed using a STENT SYNERGY XD 2.50X20.   A drug-eluting stent was successfully placed using a STENT SYNERGY XD [id number].   A drug-eluting stent was successfully placed using a STENT SYNERGY XD 3.0X16.   Post intervention, there is a 0% residual stenosis.   Post intervention, there is a 0% residual stenosis.   Post intervention, there is a 0% residual stenosis.   LV end diastolic pressure is mildly elevated.   Recommend uninterrupted dual antiplatelet therapy with Aspirin  81mg  daily and Ticagrelor  90mg  twice daily for a minimum of 12 months (ACS-Class I recommendation). Severe 2 vessel obstructive CAD with culprit lesion in the mid RCA Mildly  elevated LVEDP 19 mm Hg Successful PCI of the distal and mid RCA with DES x 2 Successful PCI of the LCx with DES Plan: DAPT for one year. Check LV function by Echo. May be a candidate for early DC if no complications.     Assessment and Plan: Acute inferior STEMI. Onset unclear as she has clearly been having progressive angina for 2 weeks. Acutely ill. Will proceed with emergent cardiac cath and PCI. HLD. Severely elevated lipids recently. Will increase Crestor  to 40 mg. Continue fenofibrate  and EPA.  Tobacco abuse. Encourage smoking cessation COPD Carotid arterial disease.  DM type 2  Risk Assessment/Risk Scores:   TIMI Risk Score for ST  Elevation MI:   The patient's TIMI risk score is 5, which indicates a 12.4% risk of all cause mortality at 30 days.      Code Status: Full Code  Severity of Illness: The appropriate patient status for this patient is INPATIENT. Inpatient status is judged to be reasonable and necessary in order to provide the required intensity of service to ensure the patient's safety. The patient's presenting symptoms, physical exam findings, and initial radiographic and laboratory data in the context of their chronic comorbidities is felt to place them at high risk for further clinical deterioration. Furthermore, it is not anticipated that the patient will be medically stable for discharge from the hospital within 2 midnights of admission.   * I certify that at the point of admission it is my clinical judgment that the patient will require inpatient hospital care spanning beyond 2 midnights from the point of admission due to high intensity of service, high risk for further deterioration and high frequency of surveillance required.*  For questions or updates, please contact [facility] Please consult [email or web address] for contact info under       Signed, Tinsley Lomas, MD  07/09/2024 8:02 PM      [1]  Allergies Allergen Reactions   Gabapentin  Anaphylaxis   Morphine Other (See Comments)    Felt like body was folding over per pt Out-of-body experience    Nalbuphine Other (See Comments)    Felt like body was folding over per pt Out-of-body experience    Penicillins Anaphylaxis    anaphylaxis anaphylaxis    Bee Pollen Other (See Comments)    Stuffy nose Stuffy nose   Diphenhydramine Hcl Other (See Comments)    Chest tightness   Diphenhydramine Hcl (Sleep) Other (See Comments)    Other reaction(s): Other (See Comments) chest tightness chest tightness    Hydrocodone-Acetaminophen      Other reaction(s): Other (See Comments) Ended up in ICU; Dilaudid  okay.   Levofloxacin     Other reaction(s): Muscle Pain, Myalgias (intolerance) Muscular problems Muscular problems    Pollen  Extract Other (See Comments)    Stuffy nose Stuffy nose    Shellfish Allergy Itching    Other reaction(s): Other (See Comments) chest tightness chest tightness   Metronidazole Itching    Facial swelling Facial swelling    Moxifloxacin Nausea Only and Other (See Comments)    Lightheaded per patient Lightheaded per patient    Other Dermatitis    rash  rash  rash rash   Pedi-Pre Tape Spray [Wound Dressing Adhesive] Rash    rash rash   Sulfa Antibiotics Rash    rash rash rash rash   Sulfamethoxazole Rash   Tape Rash    rash rash

## 2024-07-10 ENCOUNTER — Other Ambulatory Visit (HOSPITAL_COMMUNITY): Payer: Self-pay

## 2024-07-10 ENCOUNTER — Inpatient Hospital Stay (HOSPITAL_COMMUNITY)

## 2024-07-10 DIAGNOSIS — E78011 Heterozygous familial hypercholesterolemia (hefh): Secondary | ICD-10-CM

## 2024-07-10 DIAGNOSIS — F17200 Nicotine dependence, unspecified, uncomplicated: Secondary | ICD-10-CM

## 2024-07-10 DIAGNOSIS — J432 Centrilobular emphysema: Secondary | ICD-10-CM

## 2024-07-10 DIAGNOSIS — I25110 Atherosclerotic heart disease of native coronary artery with unstable angina pectoris: Secondary | ICD-10-CM

## 2024-07-10 DIAGNOSIS — I2111 ST elevation (STEMI) myocardial infarction involving right coronary artery: Secondary | ICD-10-CM | POA: Diagnosis not present

## 2024-07-10 LAB — GLUCOSE, CAPILLARY
Glucose-Capillary: 132 mg/dL — ABNORMAL HIGH (ref 70–99)
Glucose-Capillary: 171 mg/dL — ABNORMAL HIGH (ref 70–99)
Glucose-Capillary: 172 mg/dL — ABNORMAL HIGH (ref 70–99)
Glucose-Capillary: 156 mg/dL — ABNORMAL HIGH (ref 70–99)

## 2024-07-10 LAB — BASIC METABOLIC PANEL WITH GFR
Anion gap: 7 (ref 5–15)
BUN: 16 mg/dL (ref 8–23)
CO2: 28 mmol/L (ref 22–32)
Calcium: 9.1 mg/dL (ref 8.9–10.3)
Chloride: 104 mmol/L (ref 98–111)
Creatinine, Ser: 0.79 mg/dL (ref 0.44–1.00)
GFR, Estimated: >60 mL/min
Glucose, Bld: 129 mg/dL — ABNORMAL HIGH (ref 70–99)
Potassium: 4.7 mmol/L (ref 3.5–5.1)
Sodium: 138 mmol/L (ref 135–145)

## 2024-07-10 LAB — ECHOCARDIOGRAM COMPLETE
Area-P 1/2: 2.78 cm2
MV M vel: 4.78 m/s
MV Peak grad: 91.4 mmHg
S' Lateral: 2.75 cm
Weight: 2160 [oz_av]

## 2024-07-10 LAB — CBC
HCT: 37.9 % (ref 36.0–46.0)
Hemoglobin: 12.5 g/dL (ref 12.0–15.0)
MCH: 32 pg (ref 26.0–34.0)
MCHC: 33 g/dL (ref 30.0–36.0)
MCV: 96.9 fL (ref 80.0–100.0)
Platelets: 239 K/uL (ref 150–400)
RBC: 3.91 MIL/uL (ref 3.87–5.11)
RDW: 13.5 % (ref 11.5–15.5)
WBC: 7.5 K/uL (ref 4.0–10.5)
nRBC: 0 % (ref 0.0–0.2)

## 2024-07-10 LAB — MRSA NEXT GEN BY PCR, NASAL: MRSA by PCR Next Gen: NOT DETECTED

## 2024-07-10 MED ORDER — FENOFIBRATE 54 MG PO TABS
54.0000 mg | ORAL_TABLET | Freq: Every day | ORAL | 1 refills | Status: DC
  Filled 2024-07-10: qty 30, 30d supply, fill #0

## 2024-07-10 MED ORDER — NITROGLYCERIN 0.4 MG SL SUBL
0.4000 mg | SUBLINGUAL_TABLET | SUBLINGUAL | 0 refills | Status: DC | PRN
  Filled 2024-07-10: qty 25, 5d supply, fill #0

## 2024-07-10 MED ORDER — NICOTINE 21 MG/24HR TD PT24
21.0000 mg | MEDICATED_PATCH | Freq: Every day | TRANSDERMAL | 0 refills | Status: DC
  Filled 2024-07-10: qty 28, 28d supply, fill #0

## 2024-07-10 MED ORDER — NICOTINE 21 MG/24HR TD PT24
21.0000 mg | MEDICATED_PATCH | Freq: Every day | TRANSDERMAL | Status: DC
  Administered 2024-07-10: 21 mg via TRANSDERMAL
  Filled 2024-07-10: qty 1

## 2024-07-10 MED ORDER — EZETIMIBE 10 MG PO TABS
10.0000 mg | ORAL_TABLET | Freq: Every day | ORAL | 0 refills | Status: DC
  Filled 2024-07-10: qty 30, 30d supply, fill #0

## 2024-07-10 MED ORDER — ACETAMINOPHEN 325 MG PO TABS: 650.0000 mg | ORAL_TABLET | ORAL | Status: DC | PRN

## 2024-07-10 MED ORDER — ROSUVASTATIN CALCIUM 40 MG PO TABS
40.0000 mg | ORAL_TABLET | Freq: Every day | ORAL | 0 refills | Status: DC
  Filled 2024-07-10: qty 30, 30d supply, fill #0

## 2024-07-10 MED ORDER — TICAGRELOR 90 MG PO TABS
90.0000 mg | ORAL_TABLET | Freq: Two times a day (BID) | ORAL | 1 refills | Status: DC
  Filled 2024-07-10: qty 60, 30d supply, fill #0

## 2024-07-10 MED ORDER — PERFLUTREN LIPID MICROSPHERE
1.0000 mL | INTRAVENOUS | Status: AC | PRN
  Administered 2024-07-10: 3 mL via INTRAVENOUS

## 2024-07-10 NOTE — Progress Notes (Signed)
 Echocardiogram 2D Echocardiogram has been performed.  Brianna Nichols 07/10/2024, 9:45 AM

## 2024-07-10 NOTE — Progress Notes (Signed)
 CARDIAC REHAB PHASE I   Post MI/stent education including restrictions, risk factors, exercise guidelines, antiplatelet therapy importance, MI booklet, NTG use, heart healthy diabetic diet, smoking cessation and CRP2 reviewed. All questions and concerns addressed. Will refer to AP for CRP2.    1:30-1:58 Isaiah JAYSON Liverpool, RN BSN 07/10/2024 1:58 PM

## 2024-07-10 NOTE — Discharge Summary (Signed)
 Physician Discharge Summary      Patient ID: Brianna Nichols MRN: [id number] DOB/AGE: September 30, 1950 73 y.o. Brianna Elberta Gainer, FNP   Admit date: 07/09/2024 Discharge date: 07/10/2024 1   Primary Discharge Diagnosis Acute inferior STEMI Coronary artery disease of the native vessel with unstable angina pectoris Familial hypercholesterolemia-heterozygous Asymptomatic left ICA stenosis Diabetes mellitus type 2 diet controlled without complications Tobacco use disorder COPD with centrilobular emphysema and chronic cor pulmonale with mildly reduced RV function and mild to moderate elevation in PA pressure   Significant Diagnostic Studies:  EKG 07/10/2024: Sinus bradycardia When compared with ECG of 09-Jul-2024 19:14, PREVIOUS ECG IS PRESENT Compared to 07/09/2024, lateral nonspecific ST depressions not present   Coronary angiogram 07/09/2024: Severe 2 vessel obstructive CAD with culprit lesion in the mid RCA Mildly elevated LVEDP 19 mm Hg Successful PCI of the distal and mid RCA with DES x 2, STENTS SYNERGY XD 2.75X32 in the proximal to mid and 2.5 x 20 mm Synergy XD DES in the distal RCA. Successful PCI of the LCx with 3.0 x 16 mm Synergy XD DES   ECHOCARDIOGRAM COMPLETE 07/10/2024  1. Left ventricular ejection fraction, by estimation, is 60 to 65%. The left ventricle has normal function. The left ventricle has no regional wall motion abnormalities. Left ventricular diastolic parameters are indeterminate. 2. Right ventricular systolic function is mildly reduced. The right ventricular size is normal. There is mildly elevated pulmonary artery systolic pressure. The estimated right ventricular systolic pressure is 42.9 mmHg. 3. The mitral valve is degenerative. Trivial mitral valve regurgitation. No evidence of mitral stenosis. 4. The aortic valve is tricuspid. Aortic valve regurgitation is not visualized. Aortic valve sclerosis/calcification is present, without any evidence of aortic  stenosis. 5. The inferior vena cava is dilated in size with <50% respiratory variability, suggesting right atrial pressure of 15 mmHg.   Hospital Course: Brianna Nichols is a 73 y.o. female  patient with history of tobacco abuse, carotid arterial disease, COPD, DM type 2, HLD who is being seen 07/09/2024 for the evaluation of acute inferior STEMI.   Patient has not been taking her statins on a regular basis, continues to smoke about a pack of cigarettes a day, she had severe chest pain yesterday afternoon and went to take a nap and when she woke up she still had significant chest pain and it got worse, almost had near syncope, EMS was called and she did EKG revealing ST elevations in the inferior leads and hence patient was transferred emergently to [facility] for further evaluation management.  Patient underwent emergent cardiac catheterization and successful angioplasty to the culprit occluded mid and mid to distal RCA with implantation of DES x 2, also found to have a focal lesion in the left CX for which he underwent successful angioplasty and DES placement x 1.  The following morning she remained asymptomatic, walked in the hallway without any chest pain, no significant arrhythmias on telemetry, had 2 brief 4 and 5 beat run of NSVT overnight.  However with echocardiogram revealed normal wall motion, no significant elevation in cardiac markers and EKG completely normal, it was felt that she is low risk from cardiac standpoint to be discharged home with outpatient follow-up.  Recommendations on discharge: Crestor  was increased to 40 mg daily, fenofibrate  45 mg was added, Zetia 10 mg was added, patient discharged on 81 mg of enteric-coated aspirin  and Brilinta  90 mg twice daily, will need 90-day refills on all the medications on follow-up and also needs  follow-up labs.  Lp(a) is pending and would have a low threshold to start her on PCSK9 inhibitors.  For smoking cessation which she appears to  be now motivated, she was started on nicotine 21 mg patches and may need titration doses Rx'd on follow-up.  Patient's daughter present and all questions answered, patient also appears to be very motivated for smoking cessation especially after this cardiac event.  Time spent in discharge, evaluation, medication reconciliation was 45 minutes.  Discharge Exam:    07/10/2024    9:00 AM 07/10/2024    7:02 AM 07/10/2024    6:00 AM  Vitals with BMI  Systolic 92 151 104  Diastolic 48 99 59  Pulse 64 65 64     Physical Exam Neck:     Vascular: Carotid bruit (soft bilateral) present. No JVD.  Cardiovascular:     Rate and Rhythm: Normal rate and regular rhythm.     Pulses: Intact distal pulses.     Heart sounds: Normal heart sounds. No murmur heard.    No gallop.  Pulmonary:     Effort: Pulmonary effort is normal.     Breath sounds: Normal breath sounds.  Abdominal:     General: Bowel sounds are normal.     Palpations: Abdomen is soft.  Musculoskeletal:     Right lower leg: No edema.     Left lower leg: No edema.     Labs:   Lab Results  Component Value Date   WBC 7.5 07/10/2024   HGB 12.5 07/10/2024   HCT 37.9 07/10/2024   MCV 96.9 07/10/2024   PLT 239 07/10/2024    Recent Labs  Lab 07/09/24 1819 07/09/24 1820 07/10/24 0208  NA 140   < > 138  K 4.2   < > 4.7  CL 106   < > 104  CO2 24  --  28  BUN 18   < > 16  CREATININE 0.83   < > 0.79  CALCIUM  9.0  --  9.1  PROT 6.3*  --   --   BILITOT <0.2  --   --   ALKPHOS 49  --   --   ALT 12  --   --   AST 20  --   --   GLUCOSE 149*   < > 129*   < > = values in this interval not displayed.    Lipid Panel     Component Value Date/Time   CHOL 247 (H) 07/09/2024 1819   CHOL 286 (H) 11/17/2019 1106   TRIG 212 (H) 07/09/2024 1819   HDL 40 (L) 07/09/2024 1819   HDL 39 (L) 11/17/2019 1106   CHOLHDL 6.2 07/09/2024 1819   VLDL 42 (H) 07/09/2024 1819   LDLCALC 165 (H) 07/09/2024 1819   LDLCALC 181 (H) 11/17/2019 1106     HEMOGLOBIN A1C Lab Results  Component Value Date   HGBA1C 6.5 (H) 07/09/2024   MPG 139.85 07/09/2024    FOLLOW UP PLANS AND APPOINTMENTS  Allergies as of 07/10/2024       Reactions   Gabapentin Anaphylaxis   Morphine Other (See Comments)   Felt like body was folding over per pt Out-of-body experience   Nalbuphine Other (See Comments)   Felt like body was folding over per pt Out-of-body experience   Penicillins Anaphylaxis   anaphylaxis anaphylaxis   Bee Pollen Other (See Comments)   Stuffy nose Stuffy nose   Diphenhydramine Hcl Other (See Comments)   Chest tightness  Diphenhydramine Hcl (sleep) Other (See Comments)   Other reaction(s): Other (See Comments) chest tightness chest tightness   Hydrocodone-acetaminophen     Other reaction(s): Other (See Comments) Ended up in ICU; Dilaudid  okay.   Levofloxacin    Other reaction(s): Muscle Pain, Myalgias (intolerance) Muscular problems Muscular problems   Pollen Extract Other (See Comments)   Stuffy nose Stuffy nose   Shellfish Allergy Itching   Other reaction(s): Other (See Comments) chest tightness chest tightness   Metronidazole Itching   Facial swelling Facial swelling   Moxifloxacin Nausea Only, Other (See Comments)   Lightheaded per patient Lightheaded per patient   Other Dermatitis   rash  rash rash rash   Pedi-pre Tape Spray [wound Dressing Adhesive] Rash   rash rash   Sulfa Antibiotics Rash   rash rash rash rash   Sulfamethoxazole Rash   Tape Rash   rash rash        Medication List     STOP taking these medications    fluconazole  100 MG tablet Commonly known as: Diflucan        TAKE these medications    albuterol  108 (90 Base) MCG/ACT inhaler Commonly known as: ProAir  HFA INHALE 2 PUFFS INTO LUNGS EVERY FOUR HOURS AS NEEDED What changed:  how much to take how to take this when to take this reasons to take this additional instructions   alendronate  70 MG  tablet Commonly known as: FOSAMAX  Take 1 tablet (70 mg total) by mouth every 7 (seven) days. Take with a full glass of water  on an empty stomach. Do not lay down for at least 2 hours   ALPRAZolam  0.5 MG tablet Commonly known as: XANAX  Take 1 tablet (0.5 mg total) by mouth as needed for anxiety.   aspirin  EC 81 MG tablet Take 1 tablet (81 mg total) by mouth daily. Swallow whole.   blood glucose meter kit and supplies Dispense based on patient and insurance preference. Use up to four times daily as directed. (FOR ICD-10 E10.9, E11.9).   citalopram  40 MG tablet Commonly known as: CELEXA  Take 1 tablet (40 mg total) by mouth daily.   cyclobenzaprine  10 MG tablet Commonly known as: FLEXERIL  Take 1 tablet (10 mg total) by mouth 2 (two) times daily as needed for muscle spasms.   ezetimibe 10 MG tablet Commonly known as: Zetia Take 1 tablet (10 mg total) by mouth daily.   fenofibrate  54 MG tablet Take 1 tablet (54 mg total) by mouth daily.   Fish Oil 1000 MG Caps Take 2 capsules by mouth 2 (two) times daily.   nicotine 21 mg/24hr patch Commonly known as: NICODERM CQ - dosed in mg/24 hours Place 1 patch (21 mg total) onto the skin daily.   nitroGLYCERIN  0.4 MG SL tablet Commonly known as: NITROSTAT  Place 1 tablet (0.4 mg total) under the tongue every 5 (five) minutes x 3 doses as needed for chest pain.   OneTouch Verio test strip Generic drug: glucose blood USE FOUR TIMES DAILY Dx R73.03   rosuvastatin  40 MG tablet Commonly known as: CRESTOR  Take 1 tablet (40 mg total) by mouth daily. What changed:  medication strength how much to take   SYSTANE OP Apply 1 tablet to eye daily as needed (for).   ticagrelor  90 MG Tabs tablet Commonly known as: BRILINTA  Take 1 tablet (90 mg total) by mouth 2 (two) times daily.   triamcinolone cream 0.1 % Commonly known as: KENALOG Apply 1 Application topically daily as needed (for rash).  Gordy Bergamo, MD, [facility] 07/10/2024,  9:14 AM [facility] [address] [place], [place] [address] Phone: [phone]. Fax:  [phone]

## 2024-07-11 LAB — LIPOPROTEIN A (LPA): Lipoprotein (a): 146.6 nmol/L — ABNORMAL HIGH

## 2024-07-19 NOTE — Progress Notes (Deleted)
" °  Cardiology Office Note:  .   Date:  07/19/2024  ID:  Brianna Nichols, DOB 03/17/1951, MRN [id number] PCP: Jesus Elberta Gainer, FNP  [facility] Providers Cardiologist:  Alvan Carrier, MD {  History of Present Illness: .   Brianna Nichols is a 73 y.o. female with history of CAD with recent inferior STEMI, DES x 2 to the RCA and LCx, tobacco abuse, carotid artery disease, COPD, type 2 diabetes, hyperlipidemia     CAD 06/2024 admission inferior STEMI.  Two-vessel obstructive disease in the mid RCA and LCx.  DES x 2 distal and mid RCA.  DES x 1 LCx.  EF normal.  Carotid artery stenosis 03/2023 R ICA minimal plaque, LICA 50 to 69%. 04/2024 R ICA 1 to 39%.  LICA 60 to 79% stenosis.  Other Prior workup at [facility] EP.   Tilt table 01/13/14: symptoms of dizziness, along with BP drop. No HR decrease. Suggesting autonomic dysfunction or orthostatic hypotension with blunted HR response due to medication or SSS.    Social history       CAD - 06/2024 inferior STEMI.  LHC two-vessel obstructive CAD.  DES x 2 RCA, DES x 1 LCx.  EF normal.  Mildly reduced RV.  No significant valve disease. Cath site free of any acute complications.  Carotid artery disease -04/2024 carotid Dopplers R ICA 1 to 39%.  LICA 60 to 79% stenosis. Follow-up 1 year.  Tobacco abuse  Type 2 diabetes   ROS: Denies: Chest pain, shortness of breath, orthopnea, peripheral edema, palpitations, decreased exercise intolerance, fatigue, lightheadedness.   Studies Reviewed: .         Risk Assessment/Calculations:   {Does this patient have ATRIAL FIBRILLATION?[phone]} No BP recorded.  {Refresh Note OR Click here to enter BP  :1}***       Physical Exam:   VS:  There were no vitals taken for this visit.   Wt Readings from Last 3 Encounters:  07/09/24 135 lb (61.2 kg)  05/21/24 137 lb (62.1 kg)  04/01/24 136 lb 9.6 oz (62 kg)    GEN: Well nourished, well developed in no acute distress NECK: No JVD; No  carotid bruits CARDIAC: ***RRR, no murmurs, rubs, gallops RESPIRATORY:  Clear to auscultation without rales, wheezing or rhonchi  ABDOMEN: Soft, non-tender, non-distended EXTREMITIES:  No edema; No deformity   ASSESSMENT AND PLAN: .      {The patient has an active order for outpatient cardiac rehabilitation.   Please indicate if the patient is ready to start. Do NOT delete this.  It will auto delete.  Refresh note, then sign.              Click here to document readiness and see contraindications.  :1}  Cardiac Rehabilitation Eligibility Assessment      {Are you ordering a CV Procedure (e.g. stress test, cath, DCCV, TEE, etc)?   Press F2        :[id number]}  Dispo: ***  Signed, Thom LITTIE Sluder, PA-C  "

## 2024-07-20 NOTE — Telephone Encounter (Signed)
Noted. Death certificate completed. 

## 2024-07-20 NOTE — Telephone Encounter (Signed)
 Can you call [facility] [place], [place] regarding patient's death certificate information? There is no place of death listed and I am unable to edit this.

## 2024-07-20 NOTE — Telephone Encounter (Signed)
 Spoke with marine scientist, Darleene. He was inputting the information in as we got off the phone

## 2024-07-20 NOTE — Telephone Encounter (Signed)
 Brianna Nichols is returning a missed, she was advised of this information and condolences expressed.

## 2024-07-21 ENCOUNTER — Ambulatory Visit: Admitting: Cardiology

## 2024-07-23 DEATH — deceased

## 2024-08-06 ENCOUNTER — Ambulatory Visit (HOSPITAL_BASED_OUTPATIENT_CLINIC_OR_DEPARTMENT_OTHER): Admit: 2024-08-06 | Admitting: Orthopaedic Surgery

## 2024-08-06 ENCOUNTER — Encounter (HOSPITAL_BASED_OUTPATIENT_CLINIC_OR_DEPARTMENT_OTHER): Payer: Self-pay

## 2024-08-06 SURGERY — ARTHROSCOPY, SHOULDER WITH DEBRIDEMENT
Anesthesia: General | Laterality: Left
